# Patient Record
Sex: Female | Born: 1960 | Race: White | Hispanic: No | Smoking: Never smoker
Health system: Southern US, Community
[De-identification: ages and names within clinical notes are randomized; demographics above are authoritative.]

## PROBLEM LIST (undated history)

## (undated) DIAGNOSIS — M797 Fibromyalgia: Secondary | ICD-10-CM

## (undated) DIAGNOSIS — I471 Supraventricular tachycardia, unspecified: Secondary | ICD-10-CM

## (undated) DIAGNOSIS — G43909 Migraine, unspecified, not intractable, without status migrainosus: Secondary | ICD-10-CM

## (undated) DIAGNOSIS — E162 Hypoglycemia, unspecified: Secondary | ICD-10-CM

## (undated) HISTORY — PX: APPENDECTOMY: SHX54

## (undated) HISTORY — PX: TONSILLECTOMY: SUR1361

---

## 2017-09-20 ENCOUNTER — Encounter (HOSPITAL_COMMUNITY): Payer: Self-pay | Admitting: Emergency Medicine

## 2017-09-20 ENCOUNTER — Emergency Department (HOSPITAL_COMMUNITY): Payer: Self-pay

## 2017-09-20 DIAGNOSIS — S20212A Contusion of left front wall of thorax, initial encounter: Secondary | ICD-10-CM | POA: Insufficient documentation

## 2017-09-20 DIAGNOSIS — Y929 Unspecified place or not applicable: Secondary | ICD-10-CM | POA: Insufficient documentation

## 2017-09-20 DIAGNOSIS — W01198A Fall on same level from slipping, tripping and stumbling with subsequent striking against other object, initial encounter: Secondary | ICD-10-CM | POA: Insufficient documentation

## 2017-09-20 DIAGNOSIS — Y939 Activity, unspecified: Secondary | ICD-10-CM | POA: Insufficient documentation

## 2017-09-20 DIAGNOSIS — M25512 Pain in left shoulder: Secondary | ICD-10-CM | POA: Insufficient documentation

## 2017-09-20 DIAGNOSIS — Y999 Unspecified external cause status: Secondary | ICD-10-CM | POA: Insufficient documentation

## 2017-09-20 NOTE — ED Triage Notes (Addendum)
Pt from home following a fall 2 saturdays ago. Pt reports she slipped in some spilled water on the floor and fell on her a dog gate on her left side. Pt reports rib pain and shoulder pain. Pt reports 10/10 pain. Pt denies LOC or blood thinner use. Pt denies head injury. Pt reports she is only able to take shallow breaths. Pt has clear lung sounds in all fields

## 2017-09-21 ENCOUNTER — Emergency Department (HOSPITAL_COMMUNITY)
Admission: EM | Admit: 2017-09-21 | Discharge: 2017-09-21 | Disposition: A | Discharge status: Home / Self Care | Payer: Self-pay | Attending: Emergency Medicine | Admitting: Emergency Medicine

## 2017-09-21 DIAGNOSIS — S20212A Contusion of left front wall of thorax, initial encounter: Secondary | ICD-10-CM

## 2017-09-21 DIAGNOSIS — W19XXXA Unspecified fall, initial encounter: Secondary | ICD-10-CM

## 2017-09-21 HISTORY — DX: Hypoglycemia, unspecified: E16.2

## 2017-09-21 HISTORY — DX: Fibromyalgia: M79.7

## 2017-09-21 HISTORY — DX: Supraventricular tachycardia, unspecified: I47.10

## 2017-09-21 HISTORY — DX: Supraventricular tachycardia: I47.1

## 2017-09-21 HISTORY — DX: Migraine, unspecified, not intractable, without status migrainosus: G43.909

## 2017-09-21 MED ORDER — OXYCODONE-ACETAMINOPHEN 5-325 MG PO TABS
1.0000 | ORAL_TABLET | Freq: Once | ORAL | Status: DC
  Filled 2017-09-21: qty 1

## 2017-09-21 MED ORDER — OXYCODONE-ACETAMINOPHEN 5-325 MG PO TABS: 1.0000 | ORAL_TABLET | Freq: Four times a day (QID) | ORAL | 0 refills | Status: DC | PRN

## 2017-09-21 MED ORDER — NAPROXEN 500 MG PO TABS: 500.0000 mg | ORAL_TABLET | Freq: Two times a day (BID) | ORAL | 0 refills | Status: DC

## 2017-09-21 MED ORDER — KETOROLAC TROMETHAMINE 30 MG/ML IJ SOLN
30.0000 mg | Freq: Once | INTRAMUSCULAR | Status: AC
  Administered 2017-09-21: 30 mg via INTRAMUSCULAR
  Filled 2017-09-21: qty 1

## 2017-09-21 NOTE — ED Provider Notes (Signed)
[facility]-EMERGENCY DEPT Provider Note   CSN: [id number] Arrival date & time: 09/20/17  1859     History   Chief Complaint Chief Complaint  Patient presents with  . Fall    HPI Barbara Haynes is a 57 y.o. female.  HPI  This is a 57 year old female with a history of fibromyalgia who presents with left chest wall pain and left shoulder pain following a fall.  Patient reports that she fell 9 days ago at home.  She slipped and fell to water.  She hit a baby gate with her left chest and into her left axilla.  Since that time she has had progressively worsening left chest wall pain.  It is worse with coughing and breathing.  Current pain is 10 out of 10.  She has taken Advil at home with minimal relief.  She denies any coughs or fevers.  She denies hitting her head or loss of consciousness.  She is not on any anticoagulants.  Past Medical History:  Diagnosis Date  . Fibromyalgia   . Hypoglycemia   . Migraines   . SVT (supraventricular tachycardia) (HCC)     There are no active problems to display for this patient.   Past Surgical History:  Procedure Laterality Date  . APPENDECTOMY    . TONSILLECTOMY      OB History    No data available       Home Medications    Prior to Admission medications   Medication Sig Start Date End Date Taking? Authorizing Provider  naproxen (NAPROSYN) 500 MG tablet Take 1 tablet (500 mg total) by mouth 2 (two) times daily. 09/21/17   Earnestene Angello, Mayer Masker, MD  oxyCODONE-acetaminophen (PERCOCET/ROXICET) 5-325 MG tablet Take 1 tablet by mouth every 6 (six) hours as needed for severe pain. 09/21/17   Trevelle Mcgurn, Mayer Masker, MD    Family History No family history on file.  Social History Social History   Tobacco Use  . Smoking status: Never Smoker  . Smokeless tobacco: Never Used  Substance Use Topics  . Alcohol use: No    Frequency: Never  . Drug use: No     Allergies   Erythromycin; Benadryl [diphenhydramine]; and  Sulfa antibiotics   Review of Systems Review of Systems  Constitutional: Negative for fever.  Respiratory: Negative for cough and shortness of breath.   Cardiovascular: Positive for chest pain.  Musculoskeletal:       Shoulder pain  Neurological: Negative for weakness and numbness.  All other systems reviewed and are negative.    Physical Exam Updated Vital Signs BP 105/70 (BP Location: Left Arm)   Pulse 63   Temp 97.8 F (36.6 C) (Oral)   Resp 16   SpO2 96%   Physical Exam  Constitutional: She is oriented to person, place, and time. She appears well-developed and well-nourished.  HENT:  Head: Normocephalic and atraumatic.  Cardiovascular: Normal rate, regular rhythm and normal heart sounds.  Pulmonary/Chest: Effort normal and breath sounds normal. No respiratory distress. She has no wheezes. She exhibits tenderness.  Left chest wall tenderness to palpation, no crepitus, no significant overlying skin changes or contusion  Abdominal: Soft. There is no tenderness.  Musculoskeletal:  Normal range of motion left shoulder, no obvious deformities, no deformities of the clavicle noted  Neurological: She is alert and oriented to person, place, and time.  Skin: Skin is warm and dry.  Psychiatric: She has a normal mood and affect.  Nursing note and vitals reviewed.  ED Treatments / Results  Labs (all labs ordered are listed, but only abnormal results are displayed) Labs Reviewed - No data to display  EKG  EKG Interpretation None       Radiology Dg Chest 2 View  Result Date: 09/20/2017 CLINICAL DATA:  Fall, rib pain, shortness of Breath EXAM: CHEST  2 VIEW COMPARISON:  None. FINDINGS: Heart and mediastinal contours are within normal limits. No focal opacities or effusions. No acute bony abnormality. No visible rib fracture or pneumothorax. IMPRESSION: No active cardiopulmonary disease. Electronically Signed   By: Charlett NoseKevin  Dover M.D.   On: 09/20/2017 19:55   Dg Shoulder  Left  Result Date: 09/20/2017 CLINICAL DATA:  Fall.  Left shoulder pain EXAM: LEFT SHOULDER - 2+ VIEW COMPARISON:  None. FINDINGS: There is no evidence of fracture or dislocation. There is no evidence of arthropathy or other focal bone abnormality. Soft tissues are unremarkable. IMPRESSION: Negative. Electronically Signed   By: Charlett NoseKevin  Dover M.D.   On: 09/20/2017 19:54    Procedures Procedures (including critical care time)  Medications Ordered in ED Medications  oxyCODONE-acetaminophen (PERCOCET/ROXICET) 5-325 MG per tablet 1 tablet (1 tablet Oral Not Given 09/21/17 0230)  ketorolac (TORADOL) 30 MG/ML injection 30 mg (30 mg Intramuscular Given 09/21/17 0229)     Initial Impression / Assessment and Plan / ED Course  I have reviewed the triage vital signs and the nursing notes.  Pertinent labs & imaging results that were available during my care of the patient were reviewed by me and considered in my medical decision making (see chart for details).     Patient presents with left chest wall pain following a fall over 1 week ago.  She is nontoxic.  ABCs intact.  Breath sounds are clear.  She has pain without crepitus or overlying skin changes.  X-rays are negative.  Suspect occult fracture versus contusion.  Discussed with patient the role and importance of pain control.  She may have pain for 4-6 weeks if she has an occult rib fracture.  Patient was given IM Toradol.  We will give a short course of Percocet and an incentive spirometer to encourage deep breathing.  After history, exam, and medical workup I feel the patient has been appropriately medically screened and is safe for discharge home. Pertinent diagnoses were discussed with the patient. Patient was given return precautions.  Final Clinical Impressions(s) / ED Diagnoses   Final diagnoses:  Fall, initial encounter  Chest wall contusion, left, initial encounter    ED Discharge Orders        Ordered    naproxen (NAPROSYN) 500 MG  tablet  2 times daily     09/21/17 0337    oxyCODONE-acetaminophen (PERCOCET/ROXICET) 5-325 MG tablet  Every 6 hours PRN     09/21/17 0337       Shon BatonHorton, Alanya Vukelich F, MD 09/21/17 [phone]

## 2017-09-21 NOTE — Discharge Instructions (Signed)
You were seen today for left-sided chest pain related to a fall over 1 week ago.  Your x-rays are negative.  However, you may have an occult rib fracture.  Take pain medication as prescribed.  Make sure that you are taking big deep breaths to prevent pneumonia.

## 2018-10-03 ENCOUNTER — Observation Stay (HOSPITAL_COMMUNITY)
Admission: EM | Admit: 2018-10-03 | Discharge: 2018-10-04 | Disposition: A | Discharge status: Home / Self Care | Payer: Self-pay | Attending: Cardiology | Admitting: Cardiology

## 2018-10-03 ENCOUNTER — Other Ambulatory Visit: Payer: Self-pay

## 2018-10-03 ENCOUNTER — Emergency Department (HOSPITAL_COMMUNITY): Payer: Self-pay

## 2018-10-03 ENCOUNTER — Encounter (HOSPITAL_COMMUNITY): Payer: Self-pay | Admitting: *Deleted

## 2018-10-03 DIAGNOSIS — R079 Chest pain, unspecified: Principal | ICD-10-CM | POA: Insufficient documentation

## 2018-10-03 LAB — CBC
HCT: 41.2 % (ref 36.0–46.0)
HEMOGLOBIN: 13.8 g/dL (ref 12.0–15.0)
MCH: 31.3 pg (ref 26.0–34.0)
MCHC: 33.5 g/dL (ref 30.0–36.0)
MCV: 93.4 fL (ref 80.0–100.0)
Platelets: 315 10*3/uL (ref 150–400)
RBC: 4.41 MIL/uL (ref 3.87–5.11)
RDW: 11.9 % (ref 11.5–15.5)
WBC: 9.4 10*3/uL (ref 4.0–10.5)
nRBC: 0 % (ref 0.0–0.2)

## 2018-10-03 LAB — BASIC METABOLIC PANEL
ANION GAP: 8 (ref 5–15)
BUN: 22 mg/dL — ABNORMAL HIGH (ref 6–20)
CO2: 24 mmol/L (ref 22–32)
Calcium: 9.3 mg/dL (ref 8.9–10.3)
Chloride: 107 mmol/L (ref 98–111)
Creatinine, Ser: 1.3 mg/dL — ABNORMAL HIGH (ref 0.44–1.00)
GFR calc Af Amer: 53 mL/min — ABNORMAL LOW (ref >60)
GFR calc non Af Amer: 46 mL/min — ABNORMAL LOW (ref >60)
GLUCOSE: 100 mg/dL — AB (ref 70–99)
POTASSIUM: 4.5 mmol/L (ref 3.5–5.1)
Sodium: 139 mmol/L (ref 135–145)

## 2018-10-03 LAB — TROPONIN I
Troponin I: <0.03 ng/mL (ref <0.03)
Troponin I: <0.03 ng/mL (ref <0.03)

## 2018-10-03 LAB — I-STAT TROPONIN, ED: Troponin i, poc: 0 ng/mL (ref 0.00–0.08)

## 2018-10-03 LAB — I-STAT BETA HCG BLOOD, ED (MC, WL, AP ONLY): I-stat hCG, quantitative: 5.7 m[IU]/mL — ABNORMAL HIGH (ref <5)

## 2018-10-03 LAB — SEDIMENTATION RATE: Sed Rate: 15 mm/hr (ref 0–22)

## 2018-10-03 LAB — HEPARIN LEVEL (UNFRACTIONATED): HEPARIN UNFRACTIONATED: 0.29 [IU]/mL — AB (ref 0.30–0.70)

## 2018-10-03 MED ORDER — ASPIRIN EC 81 MG PO TBEC
81.0000 mg | DELAYED_RELEASE_TABLET | Freq: Every day | ORAL | Status: DC
  Administered 2018-10-04: 81 mg via ORAL
  Filled 2018-10-03: qty 1

## 2018-10-03 MED ORDER — HEPARIN (PORCINE) 25000 UT/250ML-% IV SOLN
1000.0000 [IU]/h | INTRAVENOUS | Status: DC
  Administered 2018-10-03: 800 [IU]/h via INTRAVENOUS
  Administered 2018-10-04: 1000 [IU]/h via INTRAVENOUS
  Filled 2018-10-03 (×2): qty 250

## 2018-10-03 MED ORDER — HEPARIN BOLUS VIA INFUSION
4000.0000 [IU] | Freq: Once | INTRAVENOUS | Status: AC
  Administered 2018-10-03: 4000 [IU] via INTRAVENOUS
  Filled 2018-10-03: qty 4000

## 2018-10-03 MED ORDER — ACETAMINOPHEN 325 MG PO TABS: 650.0000 mg | ORAL_TABLET | ORAL | Status: DC | PRN

## 2018-10-03 MED ORDER — NITROGLYCERIN 0.4 MG SL SUBL: 0.4000 mg | SUBLINGUAL_TABLET | SUBLINGUAL | Status: DC | PRN

## 2018-10-03 MED ORDER — SODIUM CHLORIDE 0.9% FLUSH
3.0000 mL | Freq: Once | INTRAVENOUS | Status: AC
  Administered 2018-10-03: 3 mL via INTRAVENOUS

## 2018-10-03 MED ORDER — ONDANSETRON HCL 4 MG/2ML IJ SOLN: 4.0000 mg | Freq: Four times a day (QID) | INTRAMUSCULAR | Status: DC | PRN

## 2018-10-03 MED ORDER — ASPIRIN 81 MG PO CHEW
324.0000 mg | CHEWABLE_TABLET | Freq: Once | ORAL | Status: AC
  Administered 2018-10-03: 324 mg via ORAL
  Filled 2018-10-03: qty 4

## 2018-10-03 NOTE — ED Notes (Signed)
Pt reports back pain, worsened when standing. Left arm weakness and chest tightness and pressure since last Monday.

## 2018-10-03 NOTE — Progress Notes (Signed)
ANTICOAGULATION CONSULT NOTE   Pharmacy Consult for heparin Indication: chest pain/ACS  Allergies  Allergen Reactions  . Erythromycin Anaphylaxis  . Adhesive [Tape] Other (See Comments)    Skin peels easily  . Benadryl [Diphenhydramine]     Skin crawling feeling   . Sulfa Antibiotics Other (See Comments)    Skin crawling feeling     Patient Measurements: Height: 5\' 2"  (157.5 cm) Weight: 155 lb 10.3 oz (70.6 kg) IBW/kg (Calculated) : 50.1 Heparin Dosing Weight: 65.9 kg  Vital Signs: Temp: 97.5 F (36.4 C) (02/10 1720) Temp Source: Oral (02/10 1720) BP: 119/80 (02/10 1720) Pulse Rate: 66 (02/10 1720)  Labs: Recent Labs    10/03/18 1301 10/03/18 1519 10/03/18 2030  HGB 13.8  --   --   HCT 41.2  --   --   PLT 315  --   --   HEPARINUNFRC  --   --  0.29*  CREATININE 1.30*  --   --   TROPONINI  --  <0.03  --     Estimated Creatinine Clearance: 44.5 mL/min (A) (by C-G formula based on SCr of 1.3 mg/dL (H)).   Medical History: Past Medical History:  Diagnosis Date  . Fibromyalgia   . Hypoglycemia   . Migraines   . SVT (supraventricular tachycardia) (HCC)     Medications:  Scheduled:  . [START ON 10/04/2018] aspirin EC  81 mg Oral Daily    Assessment: 57 yof presenting with chest pain on/off for a week with SOB. No anticoag PTA. No s/sx of bleeding. Baseline CBC ordered.   Initial heparin level just slightly below goal.  No overt bleeding or complications noted.  Goal of Therapy:  Heparin level 0.3-0.7 units/ml Monitor platelets by anticoagulation protocol: Yes   Plan:  Increase IV heparin to 900 units/hr Recheck heparin level in 6 hrs Daily heparin level and CBC.  Jenetta Downer, [facility] Clinical Pharmacist Phone [phone]  10/03/2018 9:49 PM

## 2018-10-03 NOTE — H&P (Signed)
Cardiology Admission History and Physical:   Patient ID: Barbara Haynes MRN: [id number]; DOB: 09-15-1960   Admission date: 10/03/2018  Primary Care Provider: Marliss Coots, NP Primary Cardiologist: No primary care provider on file.  Primary Electrophysiologist:  None   Chief Complaint:  Chest pain/Back pain  Patient Profile:   Barbara Haynes is a 58 y.o. female with PMH of SVT, fibromyalgia, and Migraines who presented with several days of back pain and chest pain.   History of Present Illness:   Barbara Haynes is a 58 yo female with PMH of SVT, fibromyalgia, and Migraines. Reports she was seen by cardiology about 30 years ago for SVT and MVP, and placed on medication. Family hx includes her mother having SVT, as well as her daughter who had a ablation done. Denies any CAD history other then a cousin who had an MI at the age of 15. She is followed by her PCP regularly. Denies any hx of HTN, HL or DM. She is a non-smoker. Does have significant hx of fibromyalgia. Also had a shoulder injury about a year ago and has been in PT for that.   States back on 1/22 she developed centralized back pain.  Denies any recent injury to the area, or picking up anything heavy.  States that this is been off and on since that time.  Reports she is used to having back pain on a regular basis as she has fractured her spine 3 different times, but this is been worse than her typical pain.  States last Monday she developed centralized chest pressure that did not respond to activity or rest.  This is been off and on for several days.  Denies any associated shortness of breath, nausea or vomiting, or diaphoresis.  States this morning around 930 she was lying in bed whenever she experienced a recurrence of this chest pressure.  Also had some weakness in her left arm, but no radiation of pain/heaviness into the arms or jaw.  States symptoms persisted and she presented to the ED.  In the ED her initial EKG showed sinus rhythm  with J-point elevation in inferior leads and V6.  She does not have a previous EKG in the system to compare to.  Does report that she had a EKG completed about a week ago at her primary care practice, but unable to locate this in her chart.  Her initial labs showed stable electrolytes, creatinine 1.3, POC troponin negative, hemoglobin 13.8.  Chest x-ray was negative for acute findings.  She was started on IV heparin in view.  At the time of assessment she complains of centralized back pain, and pressure across her chest.  Describes this as feeling like her " 17 pound cat was sitting on her chest", currently rating pressure 5/10.   Past Medical History:  Diagnosis Date  . Fibromyalgia   . Hypoglycemia   . Migraines   . SVT (supraventricular tachycardia) (HCC)     Past Surgical History:  Procedure Laterality Date  . APPENDECTOMY    . TONSILLECTOMY       Medications Prior to Admission: Prior to Admission medications   Medication Sig Start Date End Date Taking? Authorizing Provider  naproxen (NAPROSYN) 500 MG tablet Take 1 tablet (500 mg total) by mouth 2 (two) times daily. 09/21/17   Horton, Barbette Hair, MD  oxyCODONE-acetaminophen (PERCOCET/ROXICET) 5-325 MG tablet Take 1 tablet by mouth every 6 (six) hours as needed for severe pain. 09/21/17   Horton, Barbette Hair, MD  Allergies:    Allergies  Allergen Reactions  . Erythromycin Anaphylaxis  . Benadryl [Diphenhydramine]     Skin crawling feeling   . Sulfa Antibiotics Other (See Comments)    Skin crawling feeling     Social History:   Social History   Socioeconomic History  . Marital status: Single    Spouse name: Not on file  . Number of children: Not on file  . Years of education: Not on file  . Highest education level: Not on file  Occupational History  . Not on file  Social Needs  . Financial resource strain: Not on file  . Food insecurity:    Worry: Not on file    Inability: Not on file  . Transportation needs:     Medical: Not on file    Non-medical: Not on file  Tobacco Use  . Smoking status: Never Smoker  . Smokeless tobacco: Never Used  Substance and Sexual Activity  . Alcohol use: No    Frequency: Never  . Drug use: No  . Sexual activity: Not on file  Lifestyle  . Physical activity:    Days per week: Not on file    Minutes per session: Not on file  . Stress: Not on file  Relationships  . Social connections:    Talks on phone: Not on file    Gets together: Not on file    Attends religious service: Not on file    Active member of club or organization: Not on file    Attends meetings of clubs or organizations: Not on file    Relationship status: Not on file  . Intimate partner violence:    Fear of current or ex partner: Not on file    Emotionally abused: Not on file    Physically abused: Not on file    Forced sexual activity: Not on file  Other Topics Concern  . Not on file  Social History Narrative  . Not on file    Family History:   The patient's family history includes Supraventricular tachycardia in her mother and another family member.    ROS:  Please see the history of present illness.  All other ROS reviewed and negative.     Physical Exam/Data:   Vitals:   10/03/18 1234 10/03/18 1237 10/03/18 1332 10/03/18 1346  BP:  114/74 122/75 117/71  Pulse:  61 66 67  Resp:  18 12 (!) 7  Temp:  (!) 97.5 F (36.4 C)    TempSrc:  Oral    SpO2:  99% 99% 99%  Weight: 73.5 kg     Height: _0  (1.575 m)      No intake or output data in the 24 hours ending 10/03/18 1413 Last 3 Weights 10/03/2018  Weight (lbs) 162 lb  Weight (kg) 73.483 kg     Body mass index is 29.63 kg/m.  General:  Well nourished, well developed, in no acute distress HEENT: normal Lymph: no adenopathy Neck: no JVD Endocrine:  No thryomegaly Vascular: No carotid bruits; FA pulses 2+ bilaterally without bruits  Cardiac:  normal S1, S2; RRR; no murmur  Lungs:  clear to auscultation bilaterally, no  wheezing, rhonchi or rales  Abd: soft, nontender, no hepatomegaly  Ext: no edema Musculoskeletal:  No deformities, BUE and BLE strength normal and equal Skin: warm and dry  Neuro:  CNs 2-12 intact, no focal abnormalities noted Psych:  Normal affect    EKG:  The ECG that was done on 10/03/2018  was personally reviewed and demonstrates SR with j point elevation in inferior leads and v6. No previous EKG to compare  Relevant CV Studies:  N/a  Laboratory Data:  Chemistry Recent Labs  Lab 10/03/18 1301  NA 139  K 4.5  CL 107  CO2 24  GLUCOSE 100*  BUN 22*  CREATININE 1.30*  CALCIUM 9.3  GFRNONAA 46*  GFRAA 53*  ANIONGAP 8    No results for input(s): PROT, ALBUMIN, AST, ALT, ALKPHOS, BILITOT in the last 168 hours. Hematology Recent Labs  Lab 10/03/18 1301  WBC 9.4  RBC 4.41  HGB 13.8  HCT 41.2  MCV 93.4  MCH 31.3  MCHC 33.5  RDW 11.9  PLT 315   Cardiac EnzymesNo results for input(s): TROPONINI in the last 168 hours.  Recent Labs  Lab 10/03/18 1302  TROPIPOC 0.00    BNPNo results for input(s): BNP, PROBNP in the last 168 hours.  DDimer No results for input(s): DDIMER in the last 168 hours.  Radiology/Studies:  Dg Chest 2 View  Result Date: 10/03/2018 CLINICAL DATA:  Mid chest pain radiating to back EXAM: CHEST - 2 VIEW COMPARISON:  Chest x-ray dated 09/20/2017 FINDINGS: The heart size and mediastinal contours are within normal limits. Both lungs are clear. Stable mildly accentuated kyphosis at the thoracolumbar junction. No acute or suspicious osseous abnormality. IMPRESSION: No active cardiopulmonary disease. Electronically Signed   By: Franki Cabot M.D.   On: 10/03/2018 13:29    Assessment and Plan:   Barbara Haynes is a 58 y.o. female with PMH of SVT, fibromyalgia, and Migraines who presented with several days of back pain and chest pain.   1. Chest pain: Reports intermittent episodes of chest pressure/pain since last Monday. Not associated with rest or  exertion. Episode today started at 9:30am and felt like her cat sitting on her chest. No associated shortness of breath, n/v or diaphoresis. Initial POC trop neg in the ED. Placed on IV heparin while in the ED. No hx of HTN, HL or DM, and she is a non-smoker.  -- admit and cycle troponins -- plan for coronary CT in the morning -- continue IV heparin for now pending troponins -- check ESR and Sed rate -- echo  2. Back pain: reports hx of fibromyalgia and has chronic pain from that. This is reported as more intense. Will review home medications and order as appropriate.   3. SVT: states this was over 30 years, and has not been followed by cards since then. No recurrence. Monitor on telemetry.  4. MVP: dx years ago, no issues.  -- echo as above   Severity of Illness: The appropriate patient status for this patient is OBSERVATION. Observation status is judged to be reasonable and necessary in order to provide the required intensity of service to ensure the patient's safety. The patient's presenting symptoms, physical exam findings, and initial radiographic and laboratory data in the context of their medical condition is felt to place them at decreased risk for further clinical deterioration. Furthermore, it is anticipated that the patient will be medically stable for discharge from the hospital within 2 midnights of admission. The following factors support the patient status of observation.   " The patient's presenting symptoms include chest pain. " The physical exam findings include normal exam. " The initial radiographic and laboratory data are stable labs, abnormal EKG.  For questions or updates, please contact [place] Please consult [email or web address] for contact info under    Signed, Reino Bellis, NP  10/03/2018 2:13 PM

## 2018-10-03 NOTE — ED Provider Notes (Signed)
[facility] EMERGENCY DEPARTMENT Provider Note   CSN: [id number] Arrival date & time: 10/03/18  1224     History   Chief Complaint Chief Complaint  Patient presents with  . Chest Pain    HPI Barbara Haynes is a 58 y.o. female.  HPI Patient reports she been having chest pain on and off for a little under a week.  She reports she feels pressure on the front of her chest.  She reports she has a 17 pound cat in when she is having pain feels like he is lying on her chest.  She reports as well the pain will spread out across her back.  She reports she starts to feel short of breath when she has it.  She has not had nausea or vomiting or syncope.  Patient reports this has been waxing and waning lasting 5 to 15 minutes over the past week.  She denies however that is being precipitated by exertion.  Patient does not lead a very active lifestyle but she is able to go shopping or do typical activities without getting exertional shortness of breath or chest pain.  She has history of fibromyalgia.  She reports because of that oftentimes when she has pain she usually attributes it to fibromyalgia.  She reports she does have a history of SVT and mitral valve prolapse.  She denies ever having coronary artery disease or history of catheterization.  Patient reports she does have a cousin who had early onset massive MI.  Patient where she was seen this week at an outpatient clinic for chest pain. Past Medical History:  Diagnosis Date  . Fibromyalgia   . Hypoglycemia   . Migraines   . SVT (supraventricular tachycardia) [facility])     Patient Active Problem List   Diagnosis Date Noted  . Chest pain 10/03/2018    Past Surgical History:  Procedure Laterality Date  . APPENDECTOMY    . TONSILLECTOMY       OB History   No obstetric history on file.      Home Medications    Prior to Admission medications   Medication Sig Start Date End Date Taking? Authorizing Provider  meloxicam  (MOBIC) 15 MG tablet Take 15 mg by mouth daily. 09/23/18   [provider]  naproxen (NAPROSYN) 500 MG tablet Take 1 tablet (500 mg total) by mouth 2 (two) times daily. Patient not taking: Reported on 10/03/2018 09/21/17   Horton, Mayer Masker, MD  oxyCODONE-acetaminophen (PERCOCET/ROXICET) 5-325 MG tablet Take 1 tablet by mouth every 6 (six) hours as needed for severe pain. Patient not taking: Reported on 10/03/2018 09/21/17   Horton, Mayer Masker, MD  PROVENTIL HFA 108 [phone] Base) MCG/ACT inhaler Inhale 2 puffs into the lungs 4 (four) times daily as needed for shortness of breath. 09/23/18   [provider]  sertraline (ZOLOFT) 100 MG tablet Take 100 mg by mouth every morning. 09/23/18   [provider]    Family History Family History  Problem Relation Age of Onset  . Supraventricular tachycardia Mother   . Supraventricular tachycardia Other     Social History Social History   Tobacco Use  . Smoking status: Never Smoker  . Smokeless tobacco: Never Used  Substance Use Topics  . Alcohol use: No    Frequency: Never  . Drug use: No     Allergies   Erythromycin; Adhesive [tape]; Benadryl [diphenhydramine]; and Sulfa antibiotics   Review of Systems Review of Systems 10 Systems reviewed and  are negative for acute change except as noted in the HPI.   Physical Exam Updated Vital Signs BP 105/62   Pulse 68   Temp (!) 97.5 F (36.4 C) (Oral)   Resp 12   Ht 5\' 2"  (1.575 m)   Wt 73.5 kg   SpO2 100%   BMI 29.63 kg/m   Physical Exam Constitutional:      Appearance: Normal appearance.     Comments: Patient is alert and well in appearance.  She does appear to be slightly hyperventilating.  Patient is not diaphoretic or pale.  HENT:     Head: Normocephalic and atraumatic.  Eyes:     Extraocular Movements: Extraocular movements intact.  Cardiovascular:     Rate and Rhythm: Normal rate and regular rhythm.     Pulses: Normal pulses.     Heart sounds: Normal  heart sounds.  Pulmonary:     Effort: Pulmonary effort is normal.     Breath sounds: Normal breath sounds.     Comments: Mild hyperventilation.  No respiratory distress. Abdominal:     General: There is no distension.     Palpations: Abdomen is soft.     Tenderness: There is no abdominal tenderness. There is no guarding.  Musculoskeletal: Normal range of motion.        General: No swelling or tenderness.  Skin:    General: Skin is warm and dry.  Neurological:     Mental Status: She is alert and oriented to person, place, and time.     Coordination: Coordination normal.  Psychiatric:        Mood and Affect: Mood normal.      ED Treatments / Results  Labs (all labs ordered are listed, but only abnormal results are displayed) Labs Reviewed  BASIC METABOLIC PANEL - Abnormal; Notable for the following components:      Result Value   Glucose, Bld 100 (*)    BUN 22 (*)    Creatinine, Ser 1.30 (*)    GFR calc non Af Amer 46 (*)    GFR calc Af Amer 53 (*)    All other components within normal limits  I-STAT BETA HCG BLOOD, ED ([facility], [facility], [facility] ONLY) - Abnormal; Notable for the following components:   I-stat hCG, quantitative 5.7 (*)    All other components within normal limits  CBC  HEPARIN LEVEL (UNFRACTIONATED)  I-STAT TROPONIN, ED    EKG EKG Interpretation  Date/Time:  Monday October 03 2018 12:35:43 EST Ventricular Rate:  63 PR Interval:  152 QRS Duration: 84 QT Interval:  384 QTC Calculation: 392 R Axis:   73 Text Interpretation:  Normal sinus rhythm inferior j point elevation, no old comp Confirmed by Arby BarrettePfeiffer, Kaloni Bisaillon [phone]([id number]) on 10/03/2018 12:52:34 PM   Radiology Dg Chest 2 View  Result Date: 10/03/2018 CLINICAL DATA:  Mid chest pain radiating to back EXAM: CHEST - 2 VIEW COMPARISON:  Chest x-ray dated 09/20/2017 FINDINGS: The heart size and mediastinal contours are within normal limits. Both lungs are clear. Stable mildly accentuated kyphosis at the thoracolumbar  junction. No acute or suspicious osseous abnormality. IMPRESSION: No active cardiopulmonary disease. Electronically Signed   By: Bary RichardStan  Maynard M.D.   On: 10/03/2018 13:29    Procedures Procedures (including critical care time) CRITICAL CARE Performed by: Arby BarretteMarcy Caydyn Sprung   Total critical care time: 30 minutes  Critical care time was exclusive of separately billable procedures and treating other patients.  Critical care was necessary to treat or prevent imminent  or life-threatening deterioration.  Critical care was time spent personally by me on the following activities: development of treatment plan with patient and/or surrogate as well as nursing, discussions with consultants, evaluation of patient's response to treatment, examination of patient, obtaining history from patient or surrogate, ordering and performing treatments and interventions, ordering and review of laboratory studies, ordering and review of radiographic studies, pulse oximetry and re-evaluation of patient's condition. Medications Ordered in ED Medications  heparin ADULT infusion 100 units/mL (25000 units/260mL sodium chloride 0.45%) (800 Units/hr Intravenous New Bag/Given 10/03/18 1344)  sodium chloride flush (NS) 0.9 % injection 3 mL (3 mLs Intravenous Given 10/03/18 1348)  aspirin chewable tablet 324 mg (324 mg Oral Given 10/03/18 1457)  heparin bolus via infusion 4,000 Units (4,000 Units Intravenous Bolus from Bag 10/03/18 1345)     Initial Impression / Assessment and Plan / ED Course  I have reviewed the triage vital signs and the nursing notes.  Pertinent labs & imaging results that were available during my care of the patient were reviewed by me and considered in my medical decision making (see chart for details).  Clinical Course as of Oct 03 1508  Mon Oct 03, 2018  1307 Consult: Reviewed Dr. Excell Seltzer.  Borderline EKG.  Recommend to start heparin and will be seen in consult ASAP to determine ACS backslash potential  for STEMI.   [MP]    Clinical Course User Index [MP] Arby Barrette, MD   Patient has been having chest pain intermittently for about a week.  Quality of chest pain is concerning.  However, chest pain is not being precipitated by exertion.  EKG is borderline with out available comparison at this time.  Patient to be started empirically on heparin and aspirin.  Awaiting cardiology consultation and return of diagnostic studies.  Cardiology will admit for ACS rule out.  Final Clinical Impressions(s) / ED Diagnoses   Final diagnoses:  Chest pain, unspecified type  Chest pain    ED Discharge Orders    None       Arby Barrette, MD 10/03/18 1510

## 2018-10-03 NOTE — Progress Notes (Deleted)
ANTICOAGULATION CONSULT NOTE - Initial Consult  Pharmacy Consult for Heparin Indication: chest pain/ACS  Allergies  Allergen Reactions  . Erythromycin Anaphylaxis  . Adhesive [Tape] Other (See Comments)    Skin peels easily  . Benadryl [Diphenhydramine]     Skin crawling feeling   . Sulfa Antibiotics Other (See Comments)    Skin crawling feeling     Patient Measurements: Height: 5\' 2"  (157.5 cm) Weight: 162 lb (73.5 kg) IBW/kg (Calculated) : 50.1  Vital Signs: Temp: 97.5 F (36.4 C) (02/10 1237) Temp Source: Oral (02/10 1237) BP: 105/62 (02/10 1500) Pulse Rate: 68 (02/10 1500)  Labs: Recent Labs    10/03/18 1301  HGB 13.8  HCT 41.2  PLT 315  CREATININE 1.30*    Estimated Creatinine Clearance: 45.4 mL/min (A) (by C-G formula based on SCr of 1.3 mg/dL (H)).   Medical History: Past Medical History:  Diagnosis Date  . Fibromyalgia   . Hypoglycemia   . Migraines   . SVT (supraventricular tachycardia) [facility])     Assessment: 57 year old female to begin heparin for chest pain pending troponins  Goal of Therapy:  Heparin level 0.3-0.7 units/ml Monitor platelets by anticoagulation protocol: Yes   Plan:  Heparin 4000 units iv bolus x 1 Heparin drip at 1000 units / hr Heparin level 6 hours after heparin starts Daily heparin level, CBC  Thank you Okey Regal, PharmD [phone]  10/03/2018,3:21 PM

## 2018-10-03 NOTE — ED Notes (Signed)
Cards PA at bedside. 

## 2018-10-03 NOTE — ED Notes (Signed)
Late lunch tray ordered. Crackers and ginger ale given.

## 2018-10-03 NOTE — Progress Notes (Addendum)
ANTICOAGULATION CONSULT NOTE - Initial Consult  Pharmacy Consult for heparin Indication: chest pain/ACS  Allergies  Allergen Reactions  . Erythromycin Anaphylaxis  . Benadryl [Diphenhydramine]     Skin crawling feeling   . Sulfa Antibiotics Other (See Comments)    Skin crawling feeling     Patient Measurements: Height: 5\' 2"  (157.5 cm) Weight: 162 lb (73.5 kg) IBW/kg (Calculated) : 50.1 Heparin Dosing Weight: 65.9 kg  Vital Signs: Temp: 97.5 F (36.4 C) (02/10 1237) Temp Source: Oral (02/10 1237) BP: 114/74 (02/10 1237) Pulse Rate: 61 (02/10 1237)  Labs: No results for input(s): HGB, HCT, PLT, APTT, LABPROT, INR, HEPARINUNFRC, HEPRLOWMOCWT, CREATININE, CKTOTAL, CKMB, TROPONINI in the last 72 hours.  CrCl cannot be calculated (No successful lab value found.).   Medical History: Past Medical History:  Diagnosis Date  . Fibromyalgia   . Hypoglycemia   . Migraines   . SVT (supraventricular tachycardia) (HCC)     Medications:  Scheduled:  . aspirin  324 mg Oral Once  . heparin  4,000 Units Intravenous Once  . sodium chloride flush  3 mL Intravenous Once    Assessment: 57 yof presenting with chest pain on/off for a week with SOB. No anticoag PTA. No s/sx of bleeding. Baseline CBC ordered.   Goal of Therapy:  Heparin level 0.3-0.7 units/ml Monitor platelets by anticoagulation protocol: Yes   Plan:  Give 4000 units bolus x 1 Start heparin infusion at 800 units/hr Check anti-Xa level in 6 hours and daily while on heparin Continue to monitor H&H and platelets  Sherron Monday, PharmD, BCCCP Clinical Pharmacist  Pager: [phone] Phone: [phone] 10/03/2018,1:14 PM

## 2018-10-04 ENCOUNTER — Observation Stay (HOSPITAL_BASED_OUTPATIENT_CLINIC_OR_DEPARTMENT_OTHER): Payer: Self-pay

## 2018-10-04 ENCOUNTER — Observation Stay (HOSPITAL_COMMUNITY): Payer: Self-pay

## 2018-10-04 DIAGNOSIS — I340 Nonrheumatic mitral (valve) insufficiency: Secondary | ICD-10-CM

## 2018-10-04 DIAGNOSIS — R079 Chest pain, unspecified: Secondary | ICD-10-CM

## 2018-10-04 DIAGNOSIS — I370 Nonrheumatic pulmonary valve stenosis: Secondary | ICD-10-CM

## 2018-10-04 DIAGNOSIS — R072 Precordial pain: Secondary | ICD-10-CM

## 2018-10-04 LAB — TROPONIN I: Troponin I: <0.03 ng/mL (ref <0.03)

## 2018-10-04 LAB — CBC
HCT: 35.5 % — ABNORMAL LOW (ref 36.0–46.0)
Hemoglobin: 12.1 g/dL (ref 12.0–15.0)
MCH: 31.3 pg (ref 26.0–34.0)
MCHC: 34.1 g/dL (ref 30.0–36.0)
MCV: 91.7 fL (ref 80.0–100.0)
PLATELETS: 259 10*3/uL (ref 150–400)
RBC: 3.87 MIL/uL (ref 3.87–5.11)
RDW: 12 % (ref 11.5–15.5)
WBC: 8.5 10*3/uL (ref 4.0–10.5)
nRBC: 0 % (ref 0.0–0.2)

## 2018-10-04 LAB — BASIC METABOLIC PANEL
Anion gap: 11 (ref 5–15)
BUN: 20 mg/dL (ref 6–20)
CO2: 23 mmol/L (ref 22–32)
CREATININE: 1.06 mg/dL — AB (ref 0.44–1.00)
Calcium: 8.6 mg/dL — ABNORMAL LOW (ref 8.9–10.3)
Chloride: 107 mmol/L (ref 98–111)
GFR calc Af Amer: >60 mL/min (ref >60)
GFR calc non Af Amer: 59 mL/min — ABNORMAL LOW (ref >60)
Glucose, Bld: 107 mg/dL — ABNORMAL HIGH (ref 70–99)
Potassium: 3.9 mmol/L (ref 3.5–5.1)
SODIUM: 141 mmol/L (ref 135–145)

## 2018-10-04 LAB — LIPID PANEL
Cholesterol: 206 mg/dL — ABNORMAL HIGH (ref 0–200)
HDL: 36 mg/dL — ABNORMAL LOW (ref >40)
LDL Cholesterol: 112 mg/dL — ABNORMAL HIGH (ref 0–99)
Total CHOL/HDL Ratio: 5.7 RATIO
Triglycerides: 292 mg/dL — ABNORMAL HIGH (ref <150)
VLDL: 58 mg/dL — ABNORMAL HIGH (ref 0–40)

## 2018-10-04 LAB — ECHOCARDIOGRAM COMPLETE
Height: 62 in
Weight: 2526.4 oz

## 2018-10-04 LAB — HEMOGLOBIN A1C
Hgb A1c MFr Bld: 5.1 % (ref 4.8–5.6)
Mean Plasma Glucose: 99.67 mg/dL

## 2018-10-04 LAB — HEPARIN LEVEL (UNFRACTIONATED): Heparin Unfractionated: 0.31 IU/mL (ref 0.30–0.70)

## 2018-10-04 LAB — HIV ANTIBODY (ROUTINE TESTING W REFLEX): HIV Screen 4th Generation wRfx: NONREACTIVE

## 2018-10-04 MED ORDER — NITROGLYCERIN 0.4 MG SL SUBL
0.8000 mg | SUBLINGUAL_TABLET | Freq: Once | SUBLINGUAL | Status: AC
  Administered 2018-10-04: 0.8 mg via SUBLINGUAL

## 2018-10-04 MED ORDER — NITROGLYCERIN 0.4 MG SL SUBL
SUBLINGUAL_TABLET | SUBLINGUAL | Status: AC
  Filled 2018-10-04: qty 2

## 2018-10-04 MED ORDER — IOPAMIDOL (ISOVUE-370) INJECTION 76%
80.0000 mL | Freq: Once | INTRAVENOUS | Status: AC | PRN
  Administered 2018-10-04: 80 mL via INTRAVENOUS

## 2018-10-04 NOTE — Progress Notes (Addendum)
ANTICOAGULATION CONSULT NOTE   Pharmacy Consult for heparin Indication: chest pain/ACS   Assessment: 14 yof presenting with chest pain on/off for a week with SOB. No anticoag PTA. No s/sx of bleeding. Baseline CBC ordered.   Heparin level at low end of goal (0.31) on gtt at 900 units/hr. No issues with infusion or bleeding per RN.   Goal of Therapy:  Heparin level 0.3-0.7 units/ml Monitor platelets by anticoagulation protocol: Yes   Plan:  Increase IV heparin slightly to 1000 units/hr Follow up AM labs  Resume home meds? - Sertraline / Wellbutrin SR      Allergies  Allergen Reactions  . Erythromycin Anaphylaxis  . Adhesive [Tape] Other (See Comments)    Skin peels easily  . Benadryl [Diphenhydramine]     Skin crawling feeling   . Sulfa Antibiotics Other (See Comments)    Skin crawling feeling     Patient Measurements: Height: 5\' 2"  (157.5 cm) Weight: 157 lb 14.4 oz (71.6 kg) IBW/kg (Calculated) : 50.1 Heparin Dosing Weight: 65.9 kg  Vital Signs: Temp: 98.2 F (36.8 C) (02/11 0326) Temp Source: Oral (02/11 0326) BP: 106/62 (02/11 0326) Pulse Rate: 65 (02/11 0326)  Labs: Recent Labs    10/03/18 1301 10/03/18 1519 10/03/18 2030 10/04/18 0320  HGB 13.8  --   --  12.1  HCT 41.2  --   --  35.5*  PLT 315  --   --  259  HEPARINUNFRC  --   --  0.29* 0.31  CREATININE 1.30*  --   --  1.06*  TROPONINI  --  <0.03 <0.03 <0.03    Estimated Creatinine Clearance: 54.9 mL/min (A) (by C-G formula based on SCr of 1.06 mg/dL (H)).   Medical History: Past Medical History:  Diagnosis Date  . Fibromyalgia   . Hypoglycemia   . Migraines   . SVT (supraventricular tachycardia) (HCC)     Medications:  Scheduled:  . aspirin EC  81 mg Oral Daily     Thank you Skylynne Poorbaugh, PharmD [phone]  10/04/2018

## 2018-10-04 NOTE — Progress Notes (Signed)
Progress Note  Patient Name: Barbara Haynes Date of Encounter: 10/04/2018  Primary Cardiologist: Jodelle RedBridgette Christopher, MD   Subjective   Doing ok this morning. No complaints other than being NPO for test. Denies CP at the moment.   Inpatient Medications    Scheduled Meds: . aspirin EC  81 mg Oral Daily   Continuous Infusions: . heparin 1,000 Units/hr (10/04/18 0547)   PRN Meds: acetaminophen, nitroGLYCERIN, ondansetron (ZOFRAN) IV   Vital Signs    Vitals:   10/03/18 1700 10/03/18 1720 10/03/18 2224 10/04/18 0326  BP: 107/67 119/80 112/68 106/62  Pulse: 69 66  65  Resp: 13  14 14   Temp:  (!) 97.5 F (36.4 C) 98.1 F (36.7 C) 98.2 F (36.8 C)  TempSrc:  Oral Oral Oral  SpO2: 98% 98% 97% 96%  Weight:  70.6 kg  71.6 kg  Height:  5\' 2"  (1.575 m)      Intake/Output Summary (Last 24 hours) at 10/04/2018 [id number] Last data filed at 10/04/2018 0600 Gross per 24 hour  Intake 169.69 ml  Output -  Net 169.69 ml   Last 3 Weights 10/04/2018 10/03/2018 10/03/2018  Weight (lbs) 157 lb 14.4 oz 155 lb 10.3 oz 162 lb  Weight (kg) 71.623 kg 70.6 kg 73.483 kg      Telemetry    NSR - Personally Reviewed  ECG    NSR inferior j point elevation, no old comp- Personally Reviewed  Physical Exam   GEN: middle aged WF, in No acute distress.   Neck: No JVD Cardiac: RRR, no murmurs, rubs, or gallops.  Respiratory: Clear to auscultation bilaterally. GI: Soft, nontender, non-distended  MS: No edema; No deformity. Neuro:  Nonfocal  Psych: Normal affect   Labs    Chemistry Recent Labs  Lab 10/03/18 1301 10/04/18 0320  NA 139 141  K 4.5 3.9  CL 107 107  CO2 24 23  GLUCOSE 100* 107*  BUN 22* 20  CREATININE 1.30* 1.06*  CALCIUM 9.3 8.6*  GFRNONAA 46* 59*  GFRAA 53* >60  ANIONGAP 8 11     Hematology Recent Labs  Lab 10/03/18 1301 10/04/18 0320  WBC 9.4 8.5  RBC 4.41 3.87  HGB 13.8 12.1  HCT 41.2 35.5*  MCV 93.4 91.7  MCH 31.3 31.3  MCHC 33.5 34.1  RDW 11.9 12.0   PLT 315 259    Cardiac Enzymes Recent Labs  Lab 10/03/18 1519 10/03/18 2030 10/04/18 0320  TROPONINI <0.03 <0.03 <0.03    Recent Labs  Lab 10/03/18 1302  TROPIPOC 0.00     BNPNo results for input(s): BNP, PROBNP in the last 168 hours.   DDimer No results for input(s): DDIMER in the last 168 hours.   Radiology    Dg Chest 2 View  Result Date: 10/03/2018 CLINICAL DATA:  Mid chest pain radiating to back EXAM: CHEST - 2 VIEW COMPARISON:  Chest x-ray dated 09/20/2017 FINDINGS: The heart size and mediastinal contours are within normal limits. Both lungs are clear. Stable mildly accentuated kyphosis at the thoracolumbar junction. No acute or suspicious osseous abnormality. IMPRESSION: No active cardiopulmonary disease. Electronically Signed   By: Bary RichardStan  Maynard M.D.   On: 10/03/2018 13:29    Cardiac Studies   2D Echo pending  Coronary CTA pending   Patient Profile     Barbara Haynes is a 58 y.o. female with PMH of SVT, fibromyalgia, and Migraines who presented with several days of back pain and chest pain.   Assessment & Plan  1. Chest and Back Pain: ruled out for acute MI with negative troponins x 3. ECG here notes concave J point elevation in inferior leads and V6. CXR unremarkable. Plan for coronary CTA today to define coronary anatomy. 2D echo also pending. Further cardiac w/u TBD based on coronary CTA and echo results.   2. AKI: SCr on admit was 1.30 and BUN 20. Levels improved today. Scr 1.06 and BUN normal at 20. Getting coronary CTA today. F/u BMP in the AM.   For questions or updates, please contact [facility] Please consult [email or web address] for contact info under        Signed, Robbie LisBrittainy Marshayla Mitschke, PA-C  10/04/2018, 8:28 AM

## 2018-10-04 NOTE — Discharge Summary (Signed)
Discharge Summary    Patient ID: Barbara Haynes MRN: [id number]; DOB: [birth year]  Admit date: 10/03/2018 Discharge date: 10/04/2018  Primary Care Provider: Lavinia Sharps, NP  Primary Cardiologist: Jodelle Red, MD  Primary Electrophysiologist:  None   Discharge Diagnoses    Active Problems:   Chest pain   Allergies Allergies  Allergen Reactions  . Erythromycin Anaphylaxis  . Adhesive [Tape] Other (See Comments)    Skin peels easily  . Benadryl [Diphenhydramine]     Skin crawling feeling   . Sulfa Antibiotics Other (See Comments)    Skin crawling feeling     Diagnostic Studies/Procedures    Coronary CTA 10/04/18   IMPRESSION: 1. Coronary calcium score of 0. This was 0 percentile for age and sex matched control.  2. Normal coronary origin with right dominance.  3. No evidence of CAD.  Consider non-cardiac sources of chest pain.   History of Present Illness     Barbara Haynes is a 58 yo female with PMH of SVT, fibromyalgia, and Migraines. Reports she was seen by cardiology about 30 years ago for SVT and MVP, and placed on medication. Family hx includes her mother having SVT, as well as her daughter who had a ablation done. Denies any CAD history other then a cousin who had an MI at the age of 28. She is followed by her PCP regularly. Denies any hx of HTN, HL or DM. She is a non-smoker. Does have significant hx of fibromyalgia. Also had a shoulder injury about a year ago and has been in PT for that.   Pt presented to the [facility] ED on 10/03/18 with CC of chest and back pain. She noted >1 week of centralized back and chest pain, not related to exertion. No associated symptoms. She had an episode of central chest pressure the morning of presentation which persisted.  Her EKG showed concave J point elevation in inferior leads and V6. No prior EKGs for comparison. Initial troponin was negative. Pt was admitted to tele for observation and further w/u.    Hospital Course       Pt rule out for MI with negative enzymes x 3. She underwent a coronary CTA to define coronary anatomy and study was normal. She had no recurrent symptoms while under observation. Echo was done prior to discharge and results will be reported to pt via phone. Based on history, inpatient w/u and clinical course, there was felt to be no need for additional testing. Pt was last seen and examined by Dr. Cristal Deer and felt stable for discharge home. Outpatient f/u with cardiology will be arranged.   Consultants: none   Discharge Vitals Blood pressure 107/63, pulse 68, temperature 98.2 F (36.8 C), temperature source Oral, resp. rate 14, height 5\' 2"  (1.575 m), weight 71.6 kg, SpO2 98 %.  Filed Weights   10/03/18 1234 10/03/18 1720 10/04/18 0326  Weight: 73.5 kg 70.6 kg 71.6 kg    Labs & Radiologic Studies    CBC Recent Labs    10/03/18 1301 10/04/18 0320  WBC 9.4 8.5  HGB 13.8 12.1  HCT 41.2 35.5*  MCV 93.4 91.7  PLT 315 259   Basic Metabolic Panel Recent Labs    08/11/74 1301 10/04/18 0320  NA 139 141  K 4.5 3.9  CL 107 107  CO2 24 23  GLUCOSE 100* 107*  BUN 22* 20  CREATININE 1.30* 1.06*  CALCIUM 9.3 8.6*   Liver Function Tests No results for input(s): AST, ALT, ALKPHOS, BILITOT,  PROT, ALBUMIN in the last 72 hours. No results for input(s): LIPASE, AMYLASE in the last 72 hours. Cardiac Enzymes Recent Labs    10/03/18 1519 10/03/18 2030 10/04/18 0320  TROPONINI <0.03 <0.03 <0.03   BNP Invalid input(s): POCBNP D-Dimer No results for input(s): DDIMER in the last 72 hours. Hemoglobin A1C Recent Labs    10/04/18 0320  HGBA1C 5.1   Fasting Lipid Panel Recent Labs    10/04/18 0320  CHOL 206*  HDL 36*  LDLCALC 112*  TRIG 292*  CHOLHDL 5.7   Thyroid Function Tests No results for input(s): TSH, T4TOTAL, T3FREE, THYROIDAB in the last 72 hours.  Invalid input(s): FREET3 _____________  Dg Chest 2 View  Result Date: 10/03/2018 CLINICAL DATA:  Mid chest  pain radiating to back EXAM: CHEST - 2 VIEW COMPARISON:  Chest x-ray dated 09/20/2017 FINDINGS: The heart size and mediastinal contours are within normal limits. Both lungs are clear. Stable mildly accentuated kyphosis at the thoracolumbar junction. No acute or suspicious osseous abnormality. IMPRESSION: No active cardiopulmonary disease. Electronically Signed   By: Bary Richard M.D.   On: 10/03/2018 13:29   Ct Coronary Morph W/cta Cor W/score W/ca W/cm &/or Wo/cm  Addendum Date: 10/04/2018   ADDENDUM REPORT: 10/04/2018 13:20 CLINICAL DATA:  58 year old female with chest pain. EXAM: Cardiac/Coronary  CT TECHNIQUE: The patient was scanned on a [employer]. FINDINGS: A 120 kV prospective scan was triggered in the descending thoracic aorta at 111 HU's. Axial non-contrast 3 mm slices were carried out through the heart. The data set was analyzed on a dedicated work station and scored using the Agatson method. Gantry rotation speed was 250 msecs and collimation was .6 mm. No beta blockade and 0.8 mg of sl NTG was given. The 3D data set was reconstructed in 5% intervals of the 67-82 % of the R-R cycle. Diastolic phases were analyzed on a dedicated work station using MPR, MIP and VRT modes. The patient received 80 cc of contrast. Aorta:  Normal size.  No calcifications.  No dissection. Aortic Valve:  Trileaflet.  No calcifications. Coronary Arteries:  Normal coronary origin.  Right dominance. RCA is a large dominant artery that gives rise to PDA and PLVB. There is no plaque. Left main is a large artery that gives rise to LAD and LCX arteries. LAD is a large vessel that gives rise to one diagonal artery and has no plaque. LCX is a non-dominant artery that gives rise to one large OM1 branch. There is no plaque. Other findings: Normal pulmonary vein drainage into the left atrium. Normal let atrial appendage without a thrombus. Normal size of the pulmonary artery. IMPRESSION: 1. Coronary calcium score of 0. This  was 0 percentile for age and sex matched control. 2. Normal coronary origin with right dominance. 3. No evidence of CAD.  Consider non-cardiac sources of chest pain. Electronically Signed   By: Tobias Alexander   On: 10/04/2018 13:20   Result Date: 10/04/2018 EXAM: OVER-READ INTERPRETATION CT CHEST The following report is an over-read performed by radiologist Dr. Hulan Saas of [facility] on 10/04/2018. This over-read does not include interpretation of cardiac or coronary anatomy or pathology. The coronary calcium score/coronary CTA interpretation by the cardiologist is attached. COMPARISON:  None. FINDINGS: Vascular: No visible atherosclerosis and no evidence of aneurysm involving the visualized aorta. Mediastinum/Nodes: No pathologic lymphadenopathy within the visualized mediastinum. Visualized esophagus normal in appearance. Lungs/Pleura: Visualized lung parenchyma clear. Central bronchi patent without significant bronchial wall thickening. No  pleural effusions. Upper Abdomen: Unremarkable for the early arterial phase of enhancement which accounts for the heterogeneous enhancement of the spleen. Musculoskeletal: Mild lower thoracic spondylosis. IMPRESSION: No significant extracardiac findings. Electronically Signed: By: Hulan Saashomas  Lawrence M.D. On: 10/04/2018 12:43   Disposition   Pt is being discharged home today in good condition.  Follow-up Plans & Appointments    Follow-up Information    Jodelle Redhristopher, Bridgette, MD Follow up.   Specialty:  Cardiology Why:  our office will call you with a follow-up appointment in 2-4 weeks  Contact information: [address] [place] [id number] [phone]          Discharge Instructions    Diet - low sodium heart healthy   Complete by:  As directed    Increase activity slowly   Complete by:  As directed       Discharge Medications   Allergies as of 10/04/2018      Reactions   Erythromycin Anaphylaxis   Adhesive  [tape] Other (See Comments)   Skin peels easily   Benadryl [diphenhydramine]    Skin crawling feeling    Sulfa Antibiotics Other (See Comments)   Skin crawling feeling       Medication List    STOP taking these medications   naproxen 500 MG tablet Commonly known as:  NAPROSYN   oxyCODONE-acetaminophen 5-325 MG tablet Commonly known as:  PERCOCET/ROXICET     TAKE these medications   acetaminophen 500 MG tablet Commonly known as:  TYLENOL Take 1,000 mg by mouth every 6 (six) hours as needed for mild pain or headache.   atenolol 50 MG tablet Commonly known as:  TENORMIN Take 100 mg by mouth 2 (two) times daily.   buPROPion 150 MG 12 hr tablet Commonly known as:  WELLBUTRIN SR Take 300 mg by mouth daily.   meloxicam 15 MG tablet Commonly known as:  MOBIC Take 15 mg by mouth daily.   PROVENTIL HFA 108 (90 Base) MCG/ACT inhaler Generic drug:  albuterol Inhale 2 puffs into the lungs 4 (four) times daily as needed for shortness of breath.   sertraline 100 MG tablet Commonly known as:  ZOLOFT Take 100 mg by mouth every morning.   tiZANidine 4 MG tablet Commonly known as:  ZANAFLEX Take 2 mg by mouth at bedtime.   VITAMIN D BOOSTER PO Take 2,000 tablets by mouth 2 (two) times daily.        Acute coronary syndrome (MI, NSTEMI, STEMI, etc) this admission?: No.    Outstanding Labs/Studies   None   Duration of Discharge Encounter   Greater than 30 minutes including physician time.  Signed, Robbie LisBrittainy Phynix Horton, PA-C 10/04/2018, 4:12 PM

## 2018-10-04 NOTE — Care Management Note (Signed)
Case Management Note  Patient Details  Name: Barbara Haynes MRN: [id number] Date of Birth: [birth year]  Subjective/Objective:  Pt presented for Chest Pain. Pt has PCP Lavinia Sharps NP at [facility] in [place]. Pt usually gets medications from [facility], [facility]- PTA Independent from home. Patient has support of fiance. 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                   Action/Plan: CM will see if she needs MATCH once stable to transition home. No further needs identified by CM at this time.   Expected Discharge Date:                  Expected Discharge Plan:  Home/Self Care  In-House Referral:  NA  Discharge planning Services  CM Consult  Post Acute Care Choice:  NA Choice offered to:  NA  DME Arranged:  N/A DME Agency:  NA  HH Arranged:  NA HH Agency:  NA  Status of Service:  Completed, signed off  If discussed at Long Length of Stay Meetings, dates discussed:    Additional Comments:  Gala Lewandowsky, RN 10/04/2018, 11:27 AM

## 2018-10-04 NOTE — Progress Notes (Signed)
  Echocardiogram 2D Echocardiogram has been performed.  Barbara Haynes 10/04/2018, 4:10 PM

## 2020-02-14 ENCOUNTER — Other Ambulatory Visit: Payer: Self-pay

## 2020-02-14 DIAGNOSIS — Z1231 Encounter for screening mammogram for malignant neoplasm of breast: Secondary | ICD-10-CM

## 2020-03-21 ENCOUNTER — Other Ambulatory Visit: Payer: Self-pay

## 2020-03-21 ENCOUNTER — Ambulatory Visit: Payer: Self-pay | Admitting: *Deleted

## 2020-03-21 ENCOUNTER — Ambulatory Visit
Admission: RE | Admit: 2020-03-21 | Discharge: 2020-03-21 | Disposition: A | Discharge status: Home / Self Care | Payer: Self-pay | Source: Ambulatory Visit | Attending: *Deleted | Admitting: *Deleted

## 2020-03-21 VITALS — BP 118/78 | Temp 97.7°F | Wt 173.6 lb

## 2020-03-21 DIAGNOSIS — Z1239 Encounter for other screening for malignant neoplasm of breast: Secondary | ICD-10-CM

## 2020-03-21 DIAGNOSIS — Z1231 Encounter for screening mammogram for malignant neoplasm of breast: Secondary | ICD-10-CM

## 2020-03-21 NOTE — Patient Instructions (Addendum)
Explained breast self awareness with [name]. Patient did not need a Pap smear today due to last Pap smear was 02/16/2020 per patient.  Let her know [facility] will cover Pap smears every 3 years unless has a history of abnormal Pap smears. Referred patient to the [facility] for a screening mammogram on the mobile unit. Appointment scheduled Thursday, March 21, 2020 at 1150. Patient escorted to mobile unit following B[facility]CP appointment. Let patient know the Breast Center will follow up with her within the next couple weeks with results of her mammogram by letter or phone. Barbara Haynes verbalized understanding.  Barbara Haynes, Barbara Maser, RN 3:35 PM

## 2020-03-21 NOTE — Progress Notes (Signed)
[name] is a 59 y.o. female who presents to [facility] clinic today with no complaints.   Pap Smear: Pap not smear completed today. Last Pap smear was 02/16/2020 at [facility] clinic and was normal per patient. Per patient has a history of an abnormal Pap smear around 30 years ago that a repeat Pap smear was completed for follow-up. Per patient has had at least three normal Pap smears since abnormal. Last Pap smear result is not available in Epic.   Physical exam: Breasts Breasts symmetrical. No skin abnormalities bilateral breasts. No nipple retraction bilateral breasts. No nipple discharge bilateral breasts. No lymphadenopathy. No lumps palpated bilateral breasts. No complaints of pain or tenderness on exam.       Pelvic/Bimanual Pap is not indicated today    Smoking History: Patient has never smoked.   Patient Navigation: Patient education provided. Access to services provided for patient through [facility] program.   Colorectal Cancer Screening: Per patient has never had colonoscopy completed. No complaints today.    Breast and Cervical Cancer Risk Assessment: Patient does not have family history of breast cancer, known genetic mutations, or radiation treatment to the chest before age 72. Patient does not have history of cervical dysplasia, immunocompromised, or DES exposure in-utero.  Risk Assessment    Risk Scores      03/21/2020   Last edited by: Meryl Dare, CMA   5-year risk: 1.5 %   Lifetime risk: 8.5 %          A: [facility] exam without pap smear No complaints.  P: Referred patient to the [facility] for a screening mammogram on the mobile unit. Appointment scheduled Thursday, March 21, 2020 at 1150.  Priscille Heidelberg, RN 03/21/2020 3:35 PM

## 2020-05-28 ENCOUNTER — Encounter (HOSPITAL_COMMUNITY): Payer: Self-pay | Admitting: Psychiatry

## 2020-05-28 ENCOUNTER — Other Ambulatory Visit: Payer: Self-pay

## 2020-05-28 ENCOUNTER — Ambulatory Visit (INDEPENDENT_AMBULATORY_CARE_PROVIDER_SITE_OTHER): Payer: No Payment, Other | Admitting: Psychiatry

## 2020-05-28 VITALS — BP 103/61 | HR 75 | Ht 62.5 in | Wt 158.0 lb

## 2020-05-28 DIAGNOSIS — F331 Major depressive disorder, recurrent, moderate: Secondary | ICD-10-CM | POA: Insufficient documentation

## 2020-05-28 MED ORDER — SERTRALINE HCL 100 MG PO TABS: 100.0000 mg | ORAL_TABLET | Freq: Every morning | ORAL | 0 refills | Status: DC

## 2020-05-28 MED ORDER — BUPROPION HCL ER (SR) 150 MG PO TB12: 150.0000 mg | ORAL_TABLET | Freq: Two times a day (BID) | ORAL | 0 refills | Status: DC

## 2020-05-28 NOTE — Progress Notes (Addendum)
Psychiatric Initial Adult Assessment   Patient Identification: Barbara Haynes MRN:  [id number] Date of Evaluation:  05/28/2020   Referral Source: [facility] [place]  Chief Complaint:   " I have a lot of stress in my life."  Visit Diagnosis:    ICD-10-CM   1. Moderate episode of recurrent major depressive disorder (HCC)  F33.1     History of Present Illness: This is a 59 year old female with history of depression who was seen for evaluation today.  Patient reported that she was seen different providers at family services at [facility] however after they left she did not have anyone to fill the prescriptions.  She stated that she went without any medications for about a year however due to ongoing stressors she requested her current primary care provider to fill prescriptions for her.  The PCP gave her prescriptions for sertraline 50 mg daily and Wellbutrin SR 150 mg twice daily. Patient stated that she was taking higher dose of sertraline at 100 mg in the past and she really wants to get back on the dose. Patient reported numerous stressors in her life.  She stated that she is living with her partner and they both are struggling financially.  They have leaking move and they do not have the money to get it fixed.  She also reported that her partner has numerous health conditions and all that adds to her stress.  She informed that her daughter lives close by and is moving in with them closer to them very soon however she is concerned because both her daughter and her daughter's partner do not believe in vaccination and do not wear mask in public.  She does not want them to bring infections to her. She also informed that she was recently diagnosed with stage III kidney disease which she likely suspects is secondary to chronic NSAID use for arthritis.  She stated that she is yet to see her primary care for a plan regarding that.  She denied any history of hypertension or diabetes mellitus. She also  expressed frustration about how her PCP will not write refills for her and she has to see her every month in order to get further refills. She stated that she does not feel she is getting the right kind of medical care that she deserves but secondary to lack of insurance and financial difficulties she is unable to see anyone else. She endorses depressed mood with tearfulness, poor concentration, poor appetite. She denied any manic or hypomanic symptoms.  She denied any psychotic symptoms.   Past Psychiatric History: MDD, anxiety  Previous Psychotropic Medications: Yes   Substance Abuse History in the last 12 months:  No.  Consequences of Substance Abuse: NA  Past Medical History:  Past Medical History:  Diagnosis Date  . Fibromyalgia   . Hypoglycemia   . Migraines   . SVT (supraventricular tachycardia) (HCC)     Past Surgical History:  Procedure Laterality Date  . TONSILLECTOMY      Family Psychiatric History: denied  Family History:  Family History  Problem Relation Age of Onset  . Supraventricular tachycardia Mother   . Supraventricular tachycardia Other     Social History:   Social History   Socioeconomic History  . Marital status: Single    Spouse name: Not on file  . Number of children: Not on file  . Years of education: Not on file  . Highest education level: Some college, no degree  Occupational History  . Not on  file  Tobacco Use  . Smoking status: Never Smoker  . Smokeless tobacco: Never Used  Vaping Use  . Vaping Use: Never used  Substance and Sexual Activity  . Alcohol use: No  . Drug use: No  . Sexual activity: Yes    Birth control/protection: None  Other Topics Concern  . Not on file  Social History Narrative  . Not on file   Social Determinants of Health   Financial Resource Strain:   . Difficulty of Paying Living Expenses: Not on file  Food Insecurity:   . Worried About Programme researcher, broadcasting/film/video in the Last Year: Not on file  . Ran Out of  Food in the Last Year: Not on file  Transportation Needs: No Transportation Needs  . Lack of Transportation (Medical): No  . Lack of Transportation (Non-Medical): No  Physical Activity:   . Days of Exercise per Week: Not on file  . Minutes of Exercise per Session: Not on file  Stress:   . Feeling of Stress : Not on file  Social Connections:   . Frequency of Communication with Friends and Family: Not on file  . Frequency of Social Gatherings with Friends and Family: Not on file  . Attends Religious Services: Not on file  . Active Member of Clubs or Organizations: Not on file  . Attends Banker Meetings: Not on file  . Marital Status: Not on file    Additional Social History: Lives with her boyfriend, unemployed  Allergies:   Allergies  Allergen Reactions  . Erythromycin Anaphylaxis  . Lisinopril Anaphylaxis and Swelling    Tongue swelling and lips tingling and throat feels scratchy  . Adhesive [Tape] Other (See Comments)    Skin peels easily  . Benadryl [Diphenhydramine]     Skin crawling feeling   . Sulfa Antibiotics Other (See Comments)    Skin crawling feeling     Metabolic Disorder Labs: Lab Results  Component Value Date   HGBA1C 5.1 10/04/2018   MPG 99.67 10/04/2018   No results found for: PROLACTIN Lab Results  Component Value Date   CHOL 206 (H) 10/04/2018   TRIG 292 (H) 10/04/2018   HDL 36 (L) 10/04/2018   CHOLHDL 5.7 10/04/2018   VLDL 58 (H) 10/04/2018   LDLCALC 112 (H) 10/04/2018   No results found for: TSH  Therapeutic Level Labs: No results found for: LITHIUM No results found for: CBMZ No results found for: VALPROATE  Current Medications: Current Outpatient Medications  Medication Sig Dispense Refill  . acetaminophen (TYLENOL) 500 MG tablet Take 1,000 mg by mouth every 6 (six) hours as needed for mild pain or headache. (Patient not taking: Reported on 03/21/2020)    . atenolol (TENORMIN) 50 MG tablet Take 100 mg by mouth 2 (two)  times daily.    Marland Kitchen buPROPion (WELLBUTRIN SR) 150 MG 12 hr tablet Take 300 mg by mouth daily. (Patient not taking: Reported on 03/21/2020)    . losartan (COZAAR) 25 MG tablet Take 25 mg by mouth daily.    . meloxicam (MOBIC) 15 MG tablet Take 15 mg by mouth daily. (Patient not taking: Reported on 03/21/2020)    . Nutritional Supplements (VITAMIN D BOOSTER PO) Take 2,000 tablets by mouth 2 (two) times daily. (Patient not taking: Reported on 03/21/2020)    . omeprazole (PRILOSEC) 10 MG capsule Take 10 mg by mouth daily.    Marland Kitchen PROVENTIL HFA 108 (90 Base) MCG/ACT inhaler Inhale 2 puffs into the lungs 4 (four) times  daily as needed for shortness of breath.    . sertraline (ZOLOFT) 100 MG tablet Take 100 mg by mouth every morning. (Patient not taking: Reported on 03/21/2020)    . tiZANidine (ZANAFLEX) 4 MG tablet Take 2 mg by mouth at bedtime. (Patient not taking: Reported on 03/21/2020)     No current facility-administered medications for this visit.    Musculoskeletal: Strength & Muscle Tone: within normal limits Gait & Station: normal Patient leans: N/A  Psychiatric Specialty Exam: Review of Systems  There were no vitals taken for this visit.There is no height or weight on file to calculate BMI.  General Appearance: Fairly Groomed  Eye Contact:  Good  Speech:  Clear and Coherent and Normal Rate  Volume:  Normal  Mood:  Anxious  Affect:  Congruent  Thought Process:  Goal Directed and Descriptions of Associations: Intact  Orientation:  Full (Time, Place, and Person)  Thought Content:  Logical  Suicidal Thoughts:  No  Homicidal Thoughts:  No  Memory:  Immediate;   Good Recent;   Good  Judgement:  Fair  Insight:  Fair  Psychomotor Activity:  Normal  Concentration:  Concentration: Good and Attention Span: Good  Recall:  Good  Fund of Knowledge:Good  Language: Good  Akathisia:  Negative  Handed:  Right  AIMS (if indicated):  not done  Assets:  Communication Skills Desire for  Improvement Financial Resources/Insurance Housing  ADL's:  Intact  Cognition: WNL  Sleep:  Fair      Assessment and Plan: Patient seen with ongoing depressive symptoms secondary to numerous life stressors.  She is already established with a therapist at [facility] [place] and is planning to continue counseling with them.  She was agreeable with the plan of going up on the dose of Zoloft for optimal effect. VS: Blood pressure 103/61, pulse 75, height 5' 2.5" (1.588 m), weight 158 lb (71.7 kg), SpO2 98 %.   1. Moderate episode of recurrent major depressive disorder (HCC)  - Increase sertraline (ZOLOFT) 100 MG tablet; Take 1 tablet (100 mg total) by mouth in the morning.  Dispense: 90 tablet; Refill: 0 - buPROPion (WELLBUTRIN SR) 150 MG 12 hr tablet; Take 1 tablet (150 mg total) by mouth 2 (two) times daily.  Dispense: 180 tablet; Refill: 0  Follow-up in 2 months. Continue therapy with counselor at [facility].  Zena Amos, MD 10/5/20213:50 PM

## 2020-07-25 ENCOUNTER — Ambulatory Visit (HOSPITAL_COMMUNITY): Payer: No Payment, Other | Admitting: Psychiatry

## 2020-08-29 ENCOUNTER — Other Ambulatory Visit: Payer: Self-pay

## 2020-08-29 ENCOUNTER — Telehealth (INDEPENDENT_AMBULATORY_CARE_PROVIDER_SITE_OTHER): Payer: No Payment, Other | Admitting: Psychiatry

## 2020-08-29 ENCOUNTER — Encounter (HOSPITAL_COMMUNITY): Payer: Self-pay | Admitting: Psychiatry

## 2020-08-29 DIAGNOSIS — F331 Major depressive disorder, recurrent, moderate: Secondary | ICD-10-CM | POA: Diagnosis not present

## 2020-08-29 DIAGNOSIS — F3341 Major depressive disorder, recurrent, in partial remission: Secondary | ICD-10-CM | POA: Diagnosis not present

## 2020-08-29 MED ORDER — SERTRALINE HCL 100 MG PO TABS: 100.0000 mg | ORAL_TABLET | Freq: Every morning | ORAL | 1 refills | Status: DC

## 2020-08-29 MED ORDER — BUPROPION HCL ER (SR) 150 MG PO TB12: 150.0000 mg | ORAL_TABLET | Freq: Two times a day (BID) | ORAL | 1 refills | Status: DC

## 2020-08-29 NOTE — Progress Notes (Signed)
BH OP Progress Note   Virtual Visit via Video Note  I connected with Barbara Haynes on 08/29/20 at 10:00 AM EST by a video enabled telemedicine application and verified that I am speaking with the correct person using two identifiers.  Location: Patient: Home Provider: Clinic   I discussed the limitations of evaluation and management by telemedicine and the availability of in person appointments. The patient expressed understanding and agreed to proceed.  I provided 17 minutes of non-face-to-face time during this encounter.     Patient Identification: Barbara Haynes MRN:  [id number] Date of Evaluation:  08/29/2020    Chief Complaint:   "The medicines are helping a lot."  Visit Diagnosis:    ICD-10-CM   1. Recurrent major depressive disorder, in partial remission (HCC)  F33.41     History of Present Illness: Patient reported that she is doing better because despite having all the stressors she is dealing with them in a much better way.  She stated that the medicines help her get her mind off all the stress.  She is not overthinking about everything now. She described in great detail how her daughter and daughter's boyfriend refused to get vaccinated against Covid and she feels very vulnerable when she goes to their house every day to watch there 3 young kids all below the age of 29.  She stated that she has spoken to them several times but nothing seems to be helpful.  She stated that recently her daughter's boyfriend developed some cold-like symptoms and patient is refusing to go to watch her daughter's children in her house now because she wants them to isolate completely. She also expressed frustration about her PCP wanting her to come to the office every month because she will only dispensed 30-day prescriptions at a time without any refills.  She stated that the PCP expects the patient is to be coming to see her every 30 days but her next appointment is not available until 5 to 6 weeks  later which means she has to run out of her medicines for the last couple of weeks and she has to make them stretch. She stated that she was seeing a therapist however due to insurance issues she can no longer see them and she had to switch to see a different therapist which is also requirement of the clinic in order for the patient to continue getting treatment from them.  She stated that she did not like the new therapist and she felt that therapist made her even more overwhelmed and guilty because of the way they dealt with the past issues she has had.  She informed that she has 7 cats in her house currently and she enjoys taking care of them.  She described in detail each and every one of them and how she got them.  She informed that her creatinine is still elevated and her boyfriend is still dealing with his health issues.  They state home pretty much all the time and she is no longer going to her daughter's house for the next few weeks now.   Past Psychiatric History: MDD, anxiety  Previous Psychotropic Medications: Yes   Substance Abuse History in the last 12 months:  No.  Consequences of Substance Abuse: NA  Past Medical History:  Past Medical History:  Diagnosis Date  . Fibromyalgia   . Hypoglycemia   . Migraines   . SVT (supraventricular tachycardia) (HCC)     Past Surgical History:  Procedure Laterality Date  .  TONSILLECTOMY      Family Psychiatric History: denied  Family History:  Family History  Problem Relation Age of Onset  . Supraventricular tachycardia Mother   . Supraventricular tachycardia Other     Social History:   Social History   Socioeconomic History  . Marital status: Single    Spouse name: Not on file  . Number of children: Not on file  . Years of education: Not on file  . Highest education level: Some college, no degree  Occupational History  . Not on file  Tobacco Use  . Smoking status: Never Smoker  . Smokeless tobacco: Never Used   Vaping Use  . Vaping Use: Never used  Substance and Sexual Activity  . Alcohol use: No  . Drug use: No  . Sexual activity: Yes    Birth control/protection: None  Other Topics Concern  . Not on file  Social History Narrative  . Not on file   Social Determinants of Health   Financial Resource Strain: Not on file  Food Insecurity: Not on file  Transportation Needs: No Transportation Needs  . Lack of Transportation (Medical): No  . Lack of Transportation (Non-Medical): No  Physical Activity: Not on file  Stress: Not on file  Social Connections: Not on file    Additional Social History: Lives with her boyfriend, unemployed  Allergies:   Allergies  Allergen Reactions  . Erythromycin Anaphylaxis  . Lisinopril Anaphylaxis and Swelling    Tongue swelling and lips tingling and throat feels scratchy  . Adhesive [Tape] Other (See Comments)    Skin peels easily  . Benadryl [Diphenhydramine]     Skin crawling feeling   . Sulfa Antibiotics Other (See Comments)    Skin crawling feeling     Metabolic Disorder Labs: Lab Results  Component Value Date   HGBA1C 5.1 10/04/2018   MPG 99.67 10/04/2018   No results found for: PROLACTIN Lab Results  Component Value Date   CHOL 206 (H) 10/04/2018   TRIG 292 (H) 10/04/2018   HDL 36 (L) 10/04/2018   CHOLHDL 5.7 10/04/2018   VLDL 58 (H) 10/04/2018   LDLCALC 112 (H) 10/04/2018   No results found for: TSH  Therapeutic Level Labs: No results found for: LITHIUM No results found for: CBMZ No results found for: VALPROATE  Current Medications: Current Outpatient Medications  Medication Sig Dispense Refill  . acetaminophen (TYLENOL) 500 MG tablet Take 1,000 mg by mouth every 6 (six) hours as needed for mild pain or headache.     Marland Kitchen atenolol (TENORMIN) 50 MG tablet Take 100 mg by mouth 2 (two) times daily.    Marland Kitchen buPROPion (WELLBUTRIN SR) 150 MG 12 hr tablet Take 1 tablet (150 mg total) by mouth 2 (two) times daily. 180 tablet 0  .  losartan (COZAAR) 25 MG tablet Take 25 mg by mouth daily.    . meloxicam (MOBIC) 15 MG tablet Take 15 mg by mouth daily.     . Nutritional Supplements (VITAMIN D BOOSTER PO) Take 2,000 tablets by mouth 2 (two) times daily.     Marland Kitchen omeprazole (PRILOSEC) 10 MG capsule Take 10 mg by mouth daily.    Marland Kitchen PROVENTIL HFA 108 (90 Base) MCG/ACT inhaler Inhale 2 puffs into the lungs 4 (four) times daily as needed for shortness of breath.    . sertraline (ZOLOFT) 100 MG tablet Take 1 tablet (100 mg total) by mouth in the morning. 90 tablet 0  . tiZANidine (ZANAFLEX) 4 MG tablet Take 2 mg  by mouth at bedtime.      No current facility-administered medications for this visit.    Musculoskeletal: Strength & Muscle Tone: within normal limits Gait & Station: normal Patient leans: N/A  Psychiatric Specialty Exam: Review of Systems  There were no vitals taken for this visit.There is no height or weight on file to calculate BMI.  General Appearance: Fairly Groomed  Eye Contact:  Good  Speech:  Clear and Coherent and Normal Rate  Volume:  Normal  Mood:  Euthymic  Affect:  Congruent  Thought Process:  Goal Directed and Descriptions of Associations: Intact  Orientation:  Full (Time, Place, and Person)  Thought Content:  Logical  Suicidal Thoughts:  No  Homicidal Thoughts:  No  Memory:  Immediate;   Good Recent;   Good  Judgement:  Fair  Insight:  Fair  Psychomotor Activity:  Normal  Concentration:  Concentration: Good and Attention Span: Good  Recall:  Good  Fund of Knowledge:Good  Language: Good  Akathisia:  Negative  Handed:  Right  AIMS (if indicated):  not done  Assets:  Communication Skills Desire for Improvement Financial Resources/Insurance Housing  ADL's:  Intact  Cognition: WNL  Sleep:  Fair      Assessment and Plan: Patient reported improvement in her depression and anxiety symptoms since getting back on the right doses of Wellbutrin and sertraline.  She is dealing with some  chronic stressors.   1. Recurrent major depressive disorder, in partial remission (HCC)  - sertraline (ZOLOFT) 100 MG tablet; Take 1 tablet (100 mg total) by mouth in the morning.  Dispense: 90 tablet; Refill: 1 - buPROPion (WELLBUTRIN SR) 150 MG 12 hr tablet; Take 1 tablet (150 mg total) by mouth 2 (two) times daily.  Dispense: 180 tablet; Refill: 1  Follow-up in 3 months. Continue same medications. Continue therapy with counselor at [facility] clinic.  Nev[place] Crane, MD 1/6/202210:11 AM

## 2020-11-25 ENCOUNTER — Encounter (HOSPITAL_COMMUNITY): Payer: Self-pay | Admitting: Psychiatry

## 2020-11-25 ENCOUNTER — Other Ambulatory Visit: Payer: Self-pay

## 2020-11-25 ENCOUNTER — Telehealth (INDEPENDENT_AMBULATORY_CARE_PROVIDER_SITE_OTHER): Payer: No Payment, Other | Admitting: Psychiatry

## 2020-11-25 DIAGNOSIS — F3341 Major depressive disorder, recurrent, in partial remission: Secondary | ICD-10-CM

## 2020-11-25 MED ORDER — SERTRALINE HCL 100 MG PO TABS: 100.0000 mg | ORAL_TABLET | Freq: Every morning | ORAL | 1 refills | Status: AC

## 2020-11-25 MED ORDER — BUPROPION HCL ER (SR) 150 MG PO TB12: 150.0000 mg | ORAL_TABLET | Freq: Two times a day (BID) | ORAL | 1 refills | Status: AC

## 2020-11-25 NOTE — Progress Notes (Signed)
BH OP Progress Note     Virtual Visit via Video Note  I connected with [name] on 11/25/20 at 11:20 AM EDT by a video enabled telemedicine application and verified that I am speaking with the correct person using two identifiers.  Location: Patient: Home Provider: Clinic   I discussed the limitations of evaluation and management by telemedicine and the availability of in person appointments. The patient expressed understanding and agreed to proceed.  I provided 16 minutes of non-face-to-face time during this encounter.     Patient Identification: Marlet Korte MRN:  [id number] Date of Evaluation:  11/25/2020    Chief Complaint:   "I am doing okay."  Visit Diagnosis:    ICD-10-CM   1. Recurrent major depressive disorder, in partial remission (HCC)  F33.41     History of Present Illness: Patient follows she is doing fairly well overall.  She informed that her daughter is expecting her fourth baby now.  She stated that it is a lot because her oldest grandson is not even turned 5 yet.  She stated that her daughter and her significant other are quite excited. She has been going out to help her daughter with her children.  She mentioned that her 47-year-old grandson has been receiving occupational therapy and speech therapy and has recently started saying the word grandmother.  He is making good progress and that is very pleasing to the family. Patient stated that overall her mood has been stable.  She stated that her significant other was working on getting a major procedure scheduled for the fistula and he finally has a surgery date scheduled for the same.  She mentioned that her therapist that she had [facility] [place] [facility] the [facility] is now leaving the practice.  She stated that they are connecting her to a different therapist and she is not too keen on doing that.  She requested the writer to with a therapist in this office.  She is still planning to  continue seeing the PCP despite the fact that she has to go there every month to pick up her prescriptions because the PCP will not write refills.   Past Psychiatric History: MDD, anxiety  Previous Psychotropic Medications: Yes   Substance Abuse History in the last 12 months:  No.  Consequences of Substance Abuse: NA  Past Medical History:  Past Medical History:  Diagnosis Date  . Fibromyalgia   . Hypoglycemia   . Migraines   . SVT (supraventricular tachycardia) (HCC)     Past Surgical History:  Procedure Laterality Date  . TONSILLECTOMY      Family Psychiatric History: denied  Family History:  Family History  Problem Relation Age of Onset  . Supraventricular tachycardia Mother   . Supraventricular tachycardia Other     Social History:   Social History   Socioeconomic History  . Marital status: Single    Spouse name: Not on file  . Number of children: Not on file  . Years of education: Not on file  . Highest education level: Some college, no degree  Occupational History  . Not on file  Tobacco Use  . Smoking status: Never Smoker  . Smokeless tobacco: Never Used  Vaping Use  . Vaping Use: Never used  Substance and Sexual Activity  . Alcohol use: No  . Drug use: No  . Sexual activity: Yes    Birth control/protection: None  Other Topics Concern  . Not on file  Social  History Narrative  . Not on file   Social Determinants of Health   Financial Resource Strain: Not on file  Food Insecurity: Not on file  Transportation Needs: No Transportation Needs  . Lack of Transportation (Medical): No  . Lack of Transportation (Non-Medical): No  Physical Activity: Not on file  Stress: Not on file  Social Connections: Not on file    Additional Social History: Lives with her boyfriend, unemployed  Allergies:   Allergies  Allergen Reactions  . Erythromycin Anaphylaxis  . Lisinopril Anaphylaxis and Swelling    Tongue swelling and lips tingling and throat feels  scratchy  . Adhesive [Tape] Other (See Comments)    Skin peels easily  . Benadryl [Diphenhydramine]     Skin crawling feeling   . Sulfa Antibiotics Other (See Comments)    Skin crawling feeling     Metabolic Disorder Labs: Lab Results  Component Value Date   HGBA1C 5.1 10/04/2018   MPG 99.67 10/04/2018   No results found for: PROLACTIN Lab Results  Component Value Date   CHOL 206 (H) 10/04/2018   TRIG 292 (H) 10/04/2018   HDL 36 (L) 10/04/2018   CHOLHDL 5.7 10/04/2018   VLDL 58 (H) 10/04/2018   LDLCALC 112 (H) 10/04/2018   No results found for: TSH  Therapeutic Level Labs: No results found for: LITHIUM No results found for: CBMZ No results found for: VALPROATE  Current Medications: Current Outpatient Medications  Medication Sig Dispense Refill  . acetaminophen (TYLENOL) 500 MG tablet Take 1,000 mg by mouth every 6 (six) hours as needed for mild pain or headache.     Marland Kitchen atenolol (TENORMIN) 50 MG tablet Take 100 mg by mouth 2 (two) times daily.    Marland Kitchen buPROPion (WELLBUTRIN SR) 150 MG 12 hr tablet Take 1 tablet (150 mg total) by mouth 2 (two) times daily. 180 tablet 1  . losartan (COZAAR) 25 MG tablet Take 25 mg by mouth daily.    . meloxicam (MOBIC) 15 MG tablet Take 15 mg by mouth daily.     . Nutritional Supplements (VITAMIN D BOOSTER PO) Take 2,000 tablets by mouth 2 (two) times daily.     Marland Kitchen omeprazole (PRILOSEC) 10 MG capsule Take 10 mg by mouth daily.    Marland Kitchen PROVENTIL HFA 108 (90 Base) MCG/ACT inhaler Inhale 2 puffs into the lungs 4 (four) times daily as needed for shortness of breath.    . sertraline (ZOLOFT) 100 MG tablet Take 1 tablet (100 mg total) by mouth in the morning. 90 tablet 1  . tiZANidine (ZANAFLEX) 4 MG tablet Take 2 mg by mouth at bedtime.      No current facility-administered medications for this visit.    Musculoskeletal: Strength & Muscle Tone: within normal limits Gait & Station: normal Patient leans: N/A  Psychiatric Specialty Exam: Review of  Systems  There were no vitals taken for this visit.There is no height or weight on file to calculate BMI.  General Appearance: Fairly Groomed  Eye Contact:  Good  Speech:  Clear and Coherent and Normal Rate  Volume:  Normal  Mood:  Euthymic  Affect:  Congruent  Thought Process:  Goal Directed and Descriptions of Associations: Intact  Orientation:  Full (Time, Place, and Person)  Thought Content:  Logical  Suicidal Thoughts:  No  Homicidal Thoughts:  No  Memory:  Immediate;   Good Recent;   Good  Judgement:  Fair  Insight:  Fair  Psychomotor Activity:  Normal  Concentration:  Concentration: Good  and Attention Span: Good  Recall:  Good  Fund of Knowledge:Good  Language: Good  Akathisia:  Negative  Handed:  Right  AIMS (if indicated):  not done  Assets:  Communication Skills Desire for Improvement Financial Resources/Insurance Housing  ADL's:  Intact  Cognition: WNL  Sleep:  Fair      Assessment and Plan: Patient is doing fairly well for now.  1. Recurrent major depressive disorder, in partial remission (HCC)  - sertraline (ZOLOFT) 100 MG tablet; Take 1 tablet (100 mg total) by mouth in the morning.  Dispense: 90 tablet; Refill: 1 - buPROPion (WELLBUTRIN SR) 150 MG 12 hr tablet; Take 1 tablet (150 mg total) by mouth 2 (two) times daily.  Dispense: 180 tablet; Refill: 1  Follow-up in 3 months. Continue same medications. We will connect her with family therapist in the office.    Zena Amos, MD 4/4/202211:20 AM

## 2021-01-15 ENCOUNTER — Ambulatory Visit (HOSPITAL_COMMUNITY): Payer: No Payment, Other | Admitting: Licensed Clinical Social Worker

## 2021-01-15 ENCOUNTER — Telehealth (HOSPITAL_COMMUNITY): Payer: Self-pay | Admitting: Licensed Clinical Social Worker

## 2021-01-15 NOTE — Telephone Encounter (Signed)
Pt called 01/14/21 LVM to cancel appt with Venora Maples on 01/15/21. Please void no show for 01/15/21.

## 2021-02-18 ENCOUNTER — Other Ambulatory Visit: Payer: Self-pay

## 2021-02-18 ENCOUNTER — Encounter (HOSPITAL_COMMUNITY): Payer: No Payment, Other | Admitting: Psychiatry

## 2021-02-18 NOTE — Progress Notes (Signed)
error 

## 2021-02-24 ENCOUNTER — Emergency Department (HOSPITAL_COMMUNITY)
Admission: EM | Admit: 2021-02-24 | Discharge: 2021-02-24 | Disposition: A | Discharge status: Home / Self Care | Payer: No Typology Code available for payment source | Attending: Emergency Medicine | Admitting: Emergency Medicine

## 2021-02-24 ENCOUNTER — Encounter (HOSPITAL_COMMUNITY): Payer: Self-pay | Admitting: Emergency Medicine

## 2021-02-24 ENCOUNTER — Other Ambulatory Visit: Payer: Self-pay

## 2021-02-24 DIAGNOSIS — Z23 Encounter for immunization: Secondary | ICD-10-CM | POA: Insufficient documentation

## 2021-02-24 DIAGNOSIS — S01112A Laceration without foreign body of left eyelid and periocular area, initial encounter: Secondary | ICD-10-CM | POA: Insufficient documentation

## 2021-02-24 DIAGNOSIS — W5503XA Scratched by cat, initial encounter: Secondary | ICD-10-CM | POA: Insufficient documentation

## 2021-02-24 DIAGNOSIS — Y93K9 Activity, other involving animal care: Secondary | ICD-10-CM | POA: Insufficient documentation

## 2021-02-24 MED ORDER — AMOXICILLIN-POT CLAVULANATE 875-125 MG PO TABS
1.0000 | ORAL_TABLET | Freq: Once | ORAL | Status: AC
  Administered 2021-02-24: 1 via ORAL
  Filled 2021-02-24: qty 1

## 2021-02-24 MED ORDER — AMOXICILLIN-POT CLAVULANATE 875-125 MG PO TABS: 1.0000 | ORAL_TABLET | Freq: Two times a day (BID) | ORAL | 0 refills | Status: DC

## 2021-02-24 MED ORDER — TETANUS-DIPHTH-ACELL PERTUSSIS 5-2.5-18.5 LF-MCG/0.5 IM SUSY
0.5000 mL | PREFILLED_SYRINGE | Freq: Once | INTRAMUSCULAR | Status: AC
  Administered 2021-02-24: 0.5 mL via INTRAMUSCULAR
  Filled 2021-02-24: qty 0.5

## 2021-02-24 NOTE — ED Triage Notes (Signed)
Pt reports being scratched to her left eye lid from her cat. Cat vaccinations up to date, denies vision changes to eye.

## 2021-02-24 NOTE — ED Notes (Signed)
Pt discharged and ambulated out of the ED without difficulty. 

## 2021-02-24 NOTE — ED Notes (Signed)
Wound irrigated with saline solution and patted dry.

## 2021-02-24 NOTE — ED Provider Notes (Signed)
[facility] EMERGENCY DEPARTMENT Provider Note   CSN: [id number] Arrival date & time: 02/24/21  1608     History Chief Complaint  Patient presents with   Cat Scratch    Barbara Haynes is a 60 y.o. female with past medical history significant for fibromyalgia, hypoglycemia, migraines, SVT.  HPI Patient presents to emergency department today with chief complaint of cat scratch.  This happened just prior to arrival.  She states she was holding her cat and trying to give him medicine when he accidentally scratched her left eyelid.  She has pain localized to the wound.  She has been applying a bandage which has controlled the bleeding.  She denies any pain to her eye, no visual changes.  Cat is up-to-date on rabies vaccine.  Patient states her tetanus is not up-to-date.  No medications for symptoms prior to arrival.  Patient was wearing glasses when this happened.  Denies any fever, chills, blurry vision or eye drainage.    Past Medical History:  Diagnosis Date   Fibromyalgia    Hypoglycemia    Migraines    SVT (supraventricular tachycardia) [facility])     Patient Active Problem List   Diagnosis Date Noted   Erroneous encounter - disregard 02/18/2021   Recurrent major depressive disorder, in partial remission (HCC) 11/25/2020   Moderate episode of recurrent major depressive disorder (HCC) 05/28/2020   Chest pain 10/03/2018    Past Surgical History:  Procedure Laterality Date   TONSILLECTOMY       OB History     Gravida  3   Para      Term      Preterm      AB      Living         SAB      IAB      Ectopic      Multiple      Live Births  2           Family History  Problem Relation Age of Onset   Supraventricular tachycardia Mother    Supraventricular tachycardia Other     Social History   Tobacco Use   Smoking status: Never   Smokeless tobacco: Never  Vaping Use   Vaping Use: Never used  Substance Use Topics   Alcohol use: No    Drug use: No    Home Medications Prior to Admission medications   Medication Sig Start Date End Date Taking? Authorizing Provider  amoxicillin-clavulanate (AUGMENTIN) 875-125 MG tablet Take 1 tablet by mouth every 12 (twelve) hours. 02/24/21  Yes Walisiewicz, Reighlyn Elmes E, PA-C  acetaminophen (TYLENOL) 500 MG tablet Take 1,000 mg by mouth every 6 (six) hours as needed for mild pain or headache.     [provider]  atenolol (TENORMIN) 50 MG tablet Take 100 mg by mouth 2 (two) times daily.    [provider]  buPROPion (WELLBUTRIN SR) 150 MG 12 hr tablet Take 1 tablet (150 mg total) by mouth 2 (two) times daily. 11/25/20   Zena Amos, MD  losartan (COZAAR) 25 MG tablet Take 25 mg by mouth daily.    [provider]  meloxicam (MOBIC) 15 MG tablet Take 15 mg by mouth daily.  09/23/18   [provider]  Nutritional Supplements (VITAMIN D BOOSTER PO) Take 2,000 tablets by mouth 2 (two) times daily.     [provider]  omeprazole (PRILOSEC) 10 MG capsule Take 10 mg by mouth daily.  [provider]  PROVENTIL HFA 108 (90 Base) MCG/ACT inhaler Inhale 2 puffs into the lungs 4 (four) times daily as needed for shortness of breath. 09/23/18   [provider]  sertraline (ZOLOFT) 100 MG tablet Take 1 tablet (100 mg total) by mouth in the morning. 11/25/20   Zena Amos, MD  tiZANidine (ZANAFLEX) 4 MG tablet Take 2 mg by mouth at bedtime.     [provider]    Allergies    Erythromycin, Lisinopril, Adhesive [tape], Benadryl [diphenhydramine], and Sulfa antibiotics  Review of Systems   Review of Systems All other systems are reviewed and are negative for acute change except as noted in the HPI.  Physical Exam Updated Vital Signs BP 112/73 (BP Location: Right Arm)   Pulse (!) 59   Temp 98.7 F (37.1 C) (Oral)   Resp 16   SpO2 100%   Physical Exam Vitals and nursing note reviewed.  Constitutional:      Appearance: She is  well-developed. She is not ill-appearing or toxic-appearing.  HENT:     Head: Normocephalic and atraumatic.     Nose: Nose normal.  Eyes:     General: No scleral icterus.       Right eye: No discharge.        Left eye: No discharge.     Extraocular Movements: Extraocular movements intact.     Conjunctiva/sclera: Conjunctivae normal.     Left eye: Left conjunctiva is not injected. No chemosis, exudate or hemorrhage.    Pupils: Pupils are equal, round, and reactive to light.     Comments: 1.5 cm linear laceration to left upper eyelid.  Not full-thickness.  No active bleeding.  Neck:     Vascular: No JVD.  Cardiovascular:     Rate and Rhythm: Normal rate and regular rhythm.     Pulses: Normal pulses.     Heart sounds: Normal heart sounds.  Pulmonary:     Effort: Pulmonary effort is normal.     Breath sounds: Normal breath sounds.  Abdominal:     General: There is no distension.  Musculoskeletal:        General: Normal range of motion.     Cervical back: Normal range of motion.  Skin:    General: Skin is warm and dry.  Neurological:     Mental Status: She is oriented to person, place, and time.     GCS: GCS eye subscore is 4. GCS verbal subscore is 5. GCS motor subscore is 6.     Comments: Fluent speech, no facial droop.  Psychiatric:        Behavior: Behavior normal.    ED Results / Procedures / Treatments   Labs (all labs ordered are listed, but only abnormal results are displayed) Labs Reviewed - No data to display  EKG None  Radiology No results found.  Procedures Procedures   Medications Ordered in ED Medications  Tdap (BOOSTRIX) injection 0.5 mL (0.5 mLs Intramuscular Given 02/24/21 1922)  amoxicillin-clavulanate (AUGMENTIN) 875-125 MG per tablet 1 tablet (1 tablet Oral Given 02/24/21 1923)    ED Course  I have reviewed the triage vital signs and the nursing notes.  Pertinent labs & imaging results that were available during my care of the patient were  reviewed by me and considered in my medical decision making (see chart for details).    MDM Rules/Calculators/A&P  History provided by patient with additional history obtained from chart review.    Presenting after cat scratch to her left eyelid.  Laceration is not full-thickness.  No visual changes.  Pupils are equal and reactive.  No pain with EOMs. No evidence of globe rupture or eye injury. Patient's tetanus updated today.  Wound irrigated and Augmentin given.  Laceration is not gaping and edges are well approximated.  There is no active bleeding.  Discussed with ED attending Dr. Effie Shy who agrees with plan to not close secondary to high risk of infection.  Patient is agreeable plan of care.  Given first dose of Augmentin here and will discharge home with prescription for the same.  Recommend PCP follow-up in 2 days for wound check.  Strict return precautions discussed.  Patient has had medicine at home she will take if needed. Strict return precautions discussed. Patient agreeable with plan of care.    Portions of this note were generated with Scientist, clinical (histocompatibility and immunogenetics). Dictation errors may occur despite best attempts at proofreading.  Final Clinical Impression(s) / ED Diagnoses Final diagnoses:  Cat scratch    Rx / DC Orders ED Discharge Orders          Ordered    amoxicillin-clavulanate (AUGMENTIN) 875-125 MG tablet  Every 12 hours        02/24/21 1923             Shanon Ace, PA-C 02/24/21 1930    Mancel Bale, MD 02/24/21 2332

## 2021-02-24 NOTE — ED Provider Notes (Signed)
Emergency Medicine Provider Triage Evaluation Note  Akshaya Toepfer , a 60 y.o. female  was evaluated in triage.  Pt complains of cat scratch.  Patient reports just prior to arrival her cat scratched her on the left upper eyelid, cat is up-to-date on all vaccinations and patient's tetanus was updated in the last 4 to 5 years.  Still able to see normally.  No other scratches or injuries elsewhere.  Review of Systems  Positive: Laceration Negative: Vision changes  Physical Exam  BP 112/73 (BP Location: Right Arm)   Pulse (!) 59   Temp 98.7 F (37.1 C) (Oral)   Resp 16   SpO2 100%  Gen:   Awake, no distress   Resp:  Normal effort  MSK:   Moves extremities without difficulty  Other:  2 cm laceration to the left eyelid does not appear to be full-thickness, PERRLA, EOMI  Medical Decision Making  Medically screening exam initiated at 4:45 PM.  Appropriate orders placed.  Makinzy Cleere was informed that the remainder of the evaluation will be completed by another provider, this initial triage assessment does not replace that evaluation, and the importance of remaining in the ED until their evaluation is complete.     Dartha Lodge, PA-C 02/24/21 1650    Mancel Bale, MD 02/24/21 [phone]

## 2021-02-24 NOTE — Discharge Instructions (Addendum)
Your tetanus was updated today.  You were given your first dose of antibiotic Augmentin here in the ER.  Pick up the rest of the pharmacy and take it as prescribed.  Recommend you follow-up with your primary care doctor for wound check in 2 days.  If you notice any signs of infection return to the ER.

## 2021-03-05 ENCOUNTER — Other Ambulatory Visit: Payer: Self-pay | Admitting: *Deleted

## 2021-03-14 ENCOUNTER — Encounter (HOSPITAL_COMMUNITY)
Admission: EM | Disposition: A | Discharge status: Home / Self Care | Payer: Self-pay | Source: Home / Self Care | Attending: Emergency Medicine

## 2021-03-14 ENCOUNTER — Inpatient Hospital Stay: Admit: 2021-03-14 | Payer: No Typology Code available for payment source | Admitting: General Surgery

## 2021-03-14 ENCOUNTER — Observation Stay (HOSPITAL_COMMUNITY): Payer: No Typology Code available for payment source | Admitting: Certified Registered Nurse Anesthetist

## 2021-03-14 ENCOUNTER — Other Ambulatory Visit: Payer: Self-pay

## 2021-03-14 ENCOUNTER — Encounter (HOSPITAL_COMMUNITY): Payer: Self-pay

## 2021-03-14 ENCOUNTER — Ambulatory Visit (HOSPITAL_COMMUNITY)
Admission: EM | Admit: 2021-03-14 | Discharge: 2021-03-15 | Disposition: A | Discharge status: Home / Self Care | Payer: Self-pay | Attending: General Surgery | Admitting: General Surgery

## 2021-03-14 ENCOUNTER — Emergency Department (HOSPITAL_COMMUNITY): Payer: No Typology Code available for payment source

## 2021-03-14 ENCOUNTER — Observation Stay (HOSPITAL_COMMUNITY): Payer: No Typology Code available for payment source

## 2021-03-14 DIAGNOSIS — K810 Acute cholecystitis: Secondary | ICD-10-CM

## 2021-03-14 DIAGNOSIS — Z79899 Other long term (current) drug therapy: Secondary | ICD-10-CM | POA: Insufficient documentation

## 2021-03-14 DIAGNOSIS — R109 Unspecified abdominal pain: Secondary | ICD-10-CM | POA: Diagnosis present

## 2021-03-14 DIAGNOSIS — R1013 Epigastric pain: Secondary | ICD-10-CM

## 2021-03-14 DIAGNOSIS — I129 Hypertensive chronic kidney disease with stage 1 through stage 4 chronic kidney disease, or unspecified chronic kidney disease: Secondary | ICD-10-CM | POA: Insufficient documentation

## 2021-03-14 DIAGNOSIS — Z20822 Contact with and (suspected) exposure to covid-19: Secondary | ICD-10-CM | POA: Insufficient documentation

## 2021-03-14 DIAGNOSIS — K8012 Calculus of gallbladder with acute and chronic cholecystitis without obstruction: Secondary | ICD-10-CM | POA: Insufficient documentation

## 2021-03-14 DIAGNOSIS — Z882 Allergy status to sulfonamides status: Secondary | ICD-10-CM | POA: Insufficient documentation

## 2021-03-14 DIAGNOSIS — N1831 Chronic kidney disease, stage 3a: Secondary | ICD-10-CM | POA: Insufficient documentation

## 2021-03-14 DIAGNOSIS — Z888 Allergy status to other drugs, medicaments and biological substances status: Secondary | ICD-10-CM | POA: Insufficient documentation

## 2021-03-14 DIAGNOSIS — F419 Anxiety disorder, unspecified: Secondary | ICD-10-CM | POA: Insufficient documentation

## 2021-03-14 DIAGNOSIS — E876 Hypokalemia: Secondary | ICD-10-CM | POA: Insufficient documentation

## 2021-03-14 DIAGNOSIS — I471 Supraventricular tachycardia: Secondary | ICD-10-CM | POA: Insufficient documentation

## 2021-03-14 DIAGNOSIS — Z881 Allergy status to other antibiotic agents status: Secondary | ICD-10-CM | POA: Insufficient documentation

## 2021-03-14 DIAGNOSIS — Z23 Encounter for immunization: Secondary | ICD-10-CM | POA: Insufficient documentation

## 2021-03-14 DIAGNOSIS — R7989 Other specified abnormal findings of blood chemistry: Secondary | ICD-10-CM | POA: Insufficient documentation

## 2021-03-14 DIAGNOSIS — Z791 Long term (current) use of non-steroidal anti-inflammatories (NSAID): Secondary | ICD-10-CM | POA: Insufficient documentation

## 2021-03-14 DIAGNOSIS — F3341 Major depressive disorder, recurrent, in partial remission: Secondary | ICD-10-CM | POA: Insufficient documentation

## 2021-03-14 DIAGNOSIS — Z419 Encounter for procedure for purposes other than remedying health state, unspecified: Secondary | ICD-10-CM

## 2021-03-14 DIAGNOSIS — R739 Hyperglycemia, unspecified: Secondary | ICD-10-CM | POA: Insufficient documentation

## 2021-03-14 DIAGNOSIS — K8020 Calculus of gallbladder without cholecystitis without obstruction: Secondary | ICD-10-CM | POA: Diagnosis present

## 2021-03-14 DIAGNOSIS — Z792 Long term (current) use of antibiotics: Secondary | ICD-10-CM | POA: Insufficient documentation

## 2021-03-14 HISTORY — PX: CHOLECYSTECTOMY: SHX55

## 2021-03-14 LAB — HEPATIC FUNCTION PANEL
ALT: 105 U/L — ABNORMAL HIGH (ref 0–44)
AST: 270 U/L — ABNORMAL HIGH (ref 15–41)
Albumin: 4.1 g/dL (ref 3.5–5.0)
Alkaline Phosphatase: 74 U/L (ref 38–126)
Bilirubin, Direct: 1 mg/dL — ABNORMAL HIGH (ref 0.0–0.2)
Indirect Bilirubin: 1.1 mg/dL — ABNORMAL HIGH (ref 0.3–0.9)
Total Bilirubin: 2.1 mg/dL — ABNORMAL HIGH (ref 0.3–1.2)
Total Protein: 7 g/dL (ref 6.5–8.1)

## 2021-03-14 LAB — BASIC METABOLIC PANEL
Anion gap: 12 (ref 5–15)
BUN: 22 mg/dL — ABNORMAL HIGH (ref 6–20)
CO2: 23 mmol/L (ref 22–32)
Calcium: 9.1 mg/dL (ref 8.9–10.3)
Chloride: 105 mmol/L (ref 98–111)
Creatinine, Ser: 1.18 mg/dL — ABNORMAL HIGH (ref 0.44–1.00)
GFR, Estimated: 53 mL/min — ABNORMAL LOW (ref >60)
Glucose, Bld: 157 mg/dL — ABNORMAL HIGH (ref 70–99)
Potassium: 3.2 mmol/L — ABNORMAL LOW (ref 3.5–5.1)
Sodium: 140 mmol/L (ref 135–145)

## 2021-03-14 LAB — CBC
HCT: 38 % (ref 36.0–46.0)
Hemoglobin: 13.3 g/dL (ref 12.0–15.0)
MCH: 32 pg (ref 26.0–34.0)
MCHC: 35 g/dL (ref 30.0–36.0)
MCV: 91.6 fL (ref 80.0–100.0)
Platelets: 301 10*3/uL (ref 150–400)
RBC: 4.15 MIL/uL (ref 3.87–5.11)
RDW: 12.2 % (ref 11.5–15.5)
WBC: 13.7 10*3/uL — ABNORMAL HIGH (ref 4.0–10.5)
nRBC: 0 % (ref 0.0–0.2)

## 2021-03-14 LAB — I-STAT BETA HCG BLOOD, ED (MC, WL, AP ONLY): I-stat hCG, quantitative: 6.3 m[IU]/mL — ABNORMAL HIGH (ref <5)

## 2021-03-14 LAB — TROPONIN I (HIGH SENSITIVITY): Troponin I (High Sensitivity): <2 ng/L (ref <18)

## 2021-03-14 LAB — RESP PANEL BY RT-PCR (FLU A&B, COVID) ARPGX2
Influenza A by PCR: NEGATIVE
Influenza B by PCR: NEGATIVE
SARS Coronavirus 2 by RT PCR: NEGATIVE

## 2021-03-14 LAB — MAGNESIUM: Magnesium: 2.2 mg/dL (ref 1.7–2.4)

## 2021-03-14 LAB — LIPASE, BLOOD: Lipase: 34 U/L (ref 11–51)

## 2021-03-14 LAB — HCG, SERUM, QUALITATIVE: Preg, Serum: NEGATIVE

## 2021-03-14 SURGERY — LAPAROSCOPIC CHOLECYSTECTOMY WITH INTRAOPERATIVE CHOLANGIOGRAM
Anesthesia: General

## 2021-03-14 MED ORDER — HEPARIN SODIUM (PORCINE) 5000 UNIT/ML IJ SOLN
5000.0000 [IU] | Freq: Three times a day (TID) | INTRAMUSCULAR | Status: DC
  Administered 2021-03-15: 5000 [IU] via SUBCUTANEOUS
  Filled 2021-03-14: qty 1

## 2021-03-14 MED ORDER — PROPOFOL 10 MG/ML IV BOLUS
INTRAVENOUS | Status: AC
  Filled 2021-03-14: qty 20

## 2021-03-14 MED ORDER — SODIUM CHLORIDE 0.9 % IV SOLN: INTRAVENOUS | Status: DC

## 2021-03-14 MED ORDER — PROMETHAZINE HCL 25 MG/ML IJ SOLN: 6.2500 mg | INTRAMUSCULAR | Status: DC | PRN

## 2021-03-14 MED ORDER — DEXAMETHASONE SODIUM PHOSPHATE 10 MG/ML IJ SOLN
INTRAMUSCULAR | Status: AC
  Filled 2021-03-14: qty 1

## 2021-03-14 MED ORDER — PHENYLEPHRINE HCL (PRESSORS) 10 MG/ML IV SOLN
INTRAVENOUS | Status: AC
  Filled 2021-03-14: qty 1

## 2021-03-14 MED ORDER — BUPIVACAINE-EPINEPHRINE 0.25% -1:200000 IJ SOLN
INTRAMUSCULAR | Status: DC | PRN
  Administered 2021-03-14: 20 mL

## 2021-03-14 MED ORDER — POTASSIUM CHLORIDE 10 MEQ/100ML IV SOLN
10.0000 meq | INTRAVENOUS | Status: DC
  Administered 2021-03-14: 10 meq via INTRAVENOUS
  Filled 2021-03-14 (×2): qty 100

## 2021-03-14 MED ORDER — FENTANYL CITRATE (PF) 100 MCG/2ML IJ SOLN
25.0000 ug | Freq: Once | INTRAMUSCULAR | Status: AC
  Administered 2021-03-14: 25 ug via INTRAVENOUS
  Filled 2021-03-14: qty 2

## 2021-03-14 MED ORDER — ROCURONIUM BROMIDE 10 MG/ML (PF) SYRINGE
PREFILLED_SYRINGE | INTRAVENOUS | Status: DC | PRN
  Administered 2021-03-14: 50 mg via INTRAVENOUS

## 2021-03-14 MED ORDER — LIDOCAINE 2% (20 MG/ML) 5 ML SYRINGE
INTRAMUSCULAR | Status: AC
  Filled 2021-03-14: qty 5

## 2021-03-14 MED ORDER — ACETAMINOPHEN 10 MG/ML IV SOLN
1000.0000 mg | Freq: Once | INTRAVENOUS | Status: DC | PRN
Start: 2021-03-14 — End: 2021-03-14

## 2021-03-14 MED ORDER — PHENYLEPHRINE 40 MCG/ML (10ML) SYRINGE FOR IV PUSH (FOR BLOOD PRESSURE SUPPORT)
PREFILLED_SYRINGE | INTRAVENOUS | Status: DC | PRN
  Administered 2021-03-14: 40 ug via INTRAVENOUS
  Administered 2021-03-14: 120 ug via INTRAVENOUS

## 2021-03-14 MED ORDER — LACTATED RINGERS IV SOLN: INTRAVENOUS | Status: DC | PRN

## 2021-03-14 MED ORDER — 0.9 % SODIUM CHLORIDE (POUR BTL) OPTIME
TOPICAL | Status: DC | PRN
  Administered 2021-03-14: 1000 mL

## 2021-03-14 MED ORDER — ACETAMINOPHEN 160 MG/5ML PO SOLN: 325.0000 mg | ORAL | Status: DC | PRN

## 2021-03-14 MED ORDER — IOHEXOL 300 MG/ML  SOLN
INTRAMUSCULAR | Status: DC | PRN
  Administered 2021-03-14: 30 mL

## 2021-03-14 MED ORDER — LORAZEPAM 1 MG PO TABS
1.0000 mg | ORAL_TABLET | Freq: Once | ORAL | Status: AC
  Administered 2021-03-14: 1 mg via ORAL
  Filled 2021-03-14: qty 1

## 2021-03-14 MED ORDER — SUGAMMADEX SODIUM 200 MG/2ML IV SOLN
INTRAVENOUS | Status: DC | PRN
  Administered 2021-03-14: 140 mg via INTRAVENOUS

## 2021-03-14 MED ORDER — FENTANYL CITRATE (PF) 100 MCG/2ML IJ SOLN: 25.0000 ug | INTRAMUSCULAR | Status: DC | PRN

## 2021-03-14 MED ORDER — SODIUM CHLORIDE 0.9 % IV SOLN
2.0000 g | Freq: Once | INTRAVENOUS | Status: AC
  Administered 2021-03-14: 2 g via INTRAVENOUS
  Filled 2021-03-14: qty 20

## 2021-03-14 MED ORDER — MIDAZOLAM HCL 2 MG/2ML IJ SOLN
INTRAMUSCULAR | Status: DC | PRN
  Administered 2021-03-14: 2 mg via INTRAVENOUS

## 2021-03-14 MED ORDER — PNEUMOCOCCAL VAC POLYVALENT 25 MCG/0.5ML IJ INJ
0.5000 mL | INJECTION | INTRAMUSCULAR | Status: AC
  Administered 2021-03-15: 0.5 mL via INTRAMUSCULAR
  Filled 2021-03-14: qty 0.5

## 2021-03-14 MED ORDER — SERTRALINE HCL 100 MG PO TABS
100.0000 mg | ORAL_TABLET | Freq: Every day | ORAL | Status: DC
  Administered 2021-03-15: 100 mg via ORAL
  Filled 2021-03-14: qty 1

## 2021-03-14 MED ORDER — OXYCODONE HCL 5 MG PO TABS
5.0000 mg | ORAL_TABLET | Freq: Once | ORAL | Status: DC | PRN
Start: 2021-03-14 — End: 2021-03-14

## 2021-03-14 MED ORDER — HYDROCODONE-ACETAMINOPHEN 5-325 MG PO TABS
1.0000 | ORAL_TABLET | ORAL | Status: DC | PRN
  Administered 2021-03-14 – 2021-03-15 (×2): 1 via ORAL
  Filled 2021-03-14 (×2): qty 1

## 2021-03-14 MED ORDER — LIDOCAINE HCL (CARDIAC) PF 100 MG/5ML IV SOSY
PREFILLED_SYRINGE | INTRAVENOUS | Status: DC | PRN
  Administered 2021-03-14: 40 mg via INTRAVENOUS

## 2021-03-14 MED ORDER — LOSARTAN POTASSIUM 25 MG PO TABS
25.0000 mg | ORAL_TABLET | Freq: Every day | ORAL | Status: DC
  Administered 2021-03-15: 25 mg via ORAL
  Filled 2021-03-14: qty 1

## 2021-03-14 MED ORDER — MORPHINE SULFATE (PF) 2 MG/ML IV SOLN: 1.0000 mg | INTRAVENOUS | Status: DC | PRN

## 2021-03-14 MED ORDER — PANTOPRAZOLE SODIUM 40 MG IV SOLR
40.0000 mg | Freq: Every day | INTRAVENOUS | Status: DC
  Administered 2021-03-14: 40 mg via INTRAVENOUS
  Filled 2021-03-14: qty 40

## 2021-03-14 MED ORDER — LIDOCAINE HCL 2 % IJ SOLN
INTRAMUSCULAR | Status: AC
  Filled 2021-03-14: qty 20

## 2021-03-14 MED ORDER — SODIUM CHLORIDE 0.9 % IR SOLN
Status: DC | PRN
  Administered 2021-03-14: 1000 mL

## 2021-03-14 MED ORDER — DEXMEDETOMIDINE (PRECEDEX) IN NS 20 MCG/5ML (4 MCG/ML) IV SYRINGE
PREFILLED_SYRINGE | INTRAVENOUS | Status: AC
  Filled 2021-03-14: qty 5

## 2021-03-14 MED ORDER — BUPIVACAINE-EPINEPHRINE (PF) 0.25% -1:200000 IJ SOLN
INTRAMUSCULAR | Status: AC
  Filled 2021-03-14: qty 30

## 2021-03-14 MED ORDER — ROCURONIUM BROMIDE 10 MG/ML (PF) SYRINGE
PREFILLED_SYRINGE | INTRAVENOUS | Status: AC
  Filled 2021-03-14: qty 10

## 2021-03-14 MED ORDER — PHENYLEPHRINE HCL-NACL 10-0.9 MG/250ML-% IV SOLN
INTRAVENOUS | Status: DC | PRN
  Administered 2021-03-14: 25 ug/min via INTRAVENOUS

## 2021-03-14 MED ORDER — FENTANYL CITRATE (PF) 250 MCG/5ML IJ SOLN
INTRAMUSCULAR | Status: AC
  Filled 2021-03-14: qty 5

## 2021-03-14 MED ORDER — DEXAMETHASONE SODIUM PHOSPHATE 10 MG/ML IJ SOLN
INTRAMUSCULAR | Status: DC | PRN
  Administered 2021-03-14: 5 mg via INTRAVENOUS

## 2021-03-14 MED ORDER — MIDAZOLAM HCL 2 MG/2ML IJ SOLN
INTRAMUSCULAR | Status: AC
  Filled 2021-03-14: qty 2

## 2021-03-14 MED ORDER — ACETAMINOPHEN 325 MG PO TABS: 325.0000 mg | ORAL_TABLET | ORAL | Status: DC | PRN

## 2021-03-14 MED ORDER — ATENOLOL 50 MG PO TABS
100.0000 mg | ORAL_TABLET | Freq: Two times a day (BID) | ORAL | Status: DC
  Administered 2021-03-14 – 2021-03-15 (×2): 100 mg via ORAL
  Filled 2021-03-14 (×2): qty 2

## 2021-03-14 MED ORDER — ONDANSETRON HCL 4 MG/2ML IJ SOLN: 4.0000 mg | Freq: Four times a day (QID) | INTRAMUSCULAR | Status: DC | PRN

## 2021-03-14 MED ORDER — POTASSIUM CHLORIDE IN NACL 40-0.9 MEQ/L-% IV SOLN
INTRAVENOUS | Status: DC
  Filled 2021-03-14: qty 1000

## 2021-03-14 MED ORDER — AMISULPRIDE (ANTIEMETIC) 5 MG/2ML IV SOLN: 10.0000 mg | Freq: Once | INTRAVENOUS | Status: DC | PRN

## 2021-03-14 MED ORDER — ONDANSETRON HCL 4 MG/2ML IJ SOLN
INTRAMUSCULAR | Status: AC
  Filled 2021-03-14: qty 2

## 2021-03-14 MED ORDER — OXYCODONE HCL 5 MG/5ML PO SOLN: 5.0000 mg | Freq: Once | ORAL | Status: DC | PRN

## 2021-03-14 MED ORDER — ONDANSETRON 4 MG PO TBDP
4.0000 mg | ORAL_TABLET | Freq: Four times a day (QID) | ORAL | Status: DC | PRN
  Administered 2021-03-14: 4 mg via ORAL
  Filled 2021-03-14: qty 1

## 2021-03-14 MED ORDER — ONDANSETRON HCL 4 MG/2ML IJ SOLN
INTRAMUSCULAR | Status: DC | PRN
  Administered 2021-03-14: 4 mg via INTRAVENOUS

## 2021-03-14 MED ORDER — PROPOFOL 10 MG/ML IV BOLUS
INTRAVENOUS | Status: DC | PRN
  Administered 2021-03-14: 110 mg via INTRAVENOUS

## 2021-03-14 MED ORDER — FENTANYL CITRATE (PF) 250 MCG/5ML IJ SOLN
INTRAMUSCULAR | Status: DC | PRN
  Administered 2021-03-14 (×2): 25 ug via INTRAVENOUS
  Administered 2021-03-14: 50 ug via INTRAVENOUS

## 2021-03-14 MED ORDER — BUPROPION HCL ER (SR) 150 MG PO TB12
150.0000 mg | ORAL_TABLET | Freq: Two times a day (BID) | ORAL | Status: DC
  Administered 2021-03-14 – 2021-03-15 (×2): 150 mg via ORAL
  Filled 2021-03-14 (×2): qty 1

## 2021-03-14 SURGICAL SUPPLY — 30 items
APPLIER CLIP 5 13 M/L LIGAMAX5 (MISCELLANEOUS) ×2
BAG COUNTER SPONGE SURGICOUNT (BAG) IMPLANT
CABLE HIGH FREQUENCY MONO STRZ (ELECTRODE) ×2 IMPLANT
CATH REDDICK CHOLANGI 4FR 50CM (CATHETERS) ×2 IMPLANT
CHLORAPREP W/TINT 26 (MISCELLANEOUS) ×2 IMPLANT
CLIP APPLIE 5 13 M/L LIGAMAX5 (MISCELLANEOUS) ×1 IMPLANT
COVER MAYO STAND STRL (DRAPES) ×2 IMPLANT
DECANTER SPIKE VIAL GLASS SM (MISCELLANEOUS) ×2 IMPLANT
DERMABOND ADVANCED (GAUZE/BANDAGES/DRESSINGS) ×1
DERMABOND ADVANCED .7 DNX12 (GAUZE/BANDAGES/DRESSINGS) ×1 IMPLANT
DRAPE C-ARM 42X120 X-RAY (DRAPES) ×2 IMPLANT
ELECT REM PT RETURN 15FT ADLT (MISCELLANEOUS) ×2 IMPLANT
GLOVE SURG ENC MOIS LTX SZ7.5 (GLOVE) ×2 IMPLANT
GOWN STRL REUS W/TWL XL LVL3 (GOWN DISPOSABLE) ×6 IMPLANT
HEMOSTAT SURGICEL 4X8 (HEMOSTASIS) IMPLANT
IV CATH 14GX2 1/4 (CATHETERS) ×4 IMPLANT
KIT BASIN OR (CUSTOM PROCEDURE TRAY) ×2 IMPLANT
KIT TURNOVER KIT A (KITS) ×2 IMPLANT
PENCIL SMOKE EVACUATOR (MISCELLANEOUS) IMPLANT
POUCH SPECIMEN RETRIEVAL 10MM (ENDOMECHANICALS) ×2 IMPLANT
SCISSORS LAP 5X35 DISP (ENDOMECHANICALS) ×2 IMPLANT
SET IRRIG TUBING LAPAROSCOPIC (IRRIGATION / IRRIGATOR) ×2 IMPLANT
SET TUBE SMOKE EVAC HIGH FLOW (TUBING) ×2 IMPLANT
SLEEVE XCEL OPT CAN 5 100 (ENDOMECHANICALS) ×4 IMPLANT
SUT MNCRL AB 4-0 PS2 18 (SUTURE) ×2 IMPLANT
TOWEL OR 17X26 10 PK STRL BLUE (TOWEL DISPOSABLE) ×2 IMPLANT
TOWEL OR NON WOVEN STRL DISP B (DISPOSABLE) ×2 IMPLANT
TRAY LAPAROSCOPIC (CUSTOM PROCEDURE TRAY) ×2 IMPLANT
TROCAR BLADELESS OPT 5 100 (ENDOMECHANICALS) ×2 IMPLANT
TROCAR XCEL BLUNT TIP 100MML (ENDOMECHANICALS) ×2 IMPLANT

## 2021-03-14 NOTE — Anesthesia Postprocedure Evaluation (Signed)
Anesthesia Post Note  Patient: Barbara Haynes  Procedure(s) Performed: LAPAROSCOPIC CHOLECYSTECTOMY WITH INTRAOPERATIVE CHOLANGIOGRAM     Patient location during evaluation: PACU Anesthesia Type: General Level of consciousness: awake and alert Pain management: pain level controlled Vital Signs Assessment: post-procedure vital signs reviewed and stable Respiratory status: spontaneous breathing, nonlabored ventilation, respiratory function stable and patient connected to nasal cannula oxygen Cardiovascular status: blood pressure returned to baseline and stable Postop Assessment: no apparent nausea or vomiting Anesthetic complications: no   No notable events documented.  Last Vitals:  Vitals:   03/14/21 1909 03/14/21 2034  BP: (!) 107/56 103/64  Pulse: 63 68  Resp: 18 18  Temp: 36.8 C 36.9 C  SpO2: 99% 99%    Last Pain:  Vitals:   03/14/21 2034  TempSrc: Oral  PainSc:                  Kyan Giannone

## 2021-03-14 NOTE — ED Provider Notes (Addendum)
[facility]-EMERGENCY DEPT Provider Note   CSN: [id number] Arrival date & time: 03/14/21  0505     History Chief Complaint  Patient presents with   Chest Pain    Barbara Haynes is a 60 y.o. female.  Patient with history of SVT, fibromyalgia and major depression to ED with complaint of chest pain. The patient has asked her boyfriend present at bedside to provide history because she doesn't feel she can answer any more questions. Per Phylliss Blakes, she woke this morning with pain in the right chest, breathing heavily and numbness of both arms and legs. The patient does localize the pain to right lower chest "underneath my ribs". She became tearful and symptoms have persisted since onset. No recent cough or fever. No vomiting.    Chest Pain Associated symptoms: numbness   Associated symptoms: no fever       Past Medical History:  Diagnosis Date   Fibromyalgia    Hypoglycemia    Migraines    SVT (supraventricular tachycardia) [facility])     Patient Active Problem List   Diagnosis Date Noted   Erroneous encounter - disregard 02/18/2021   Recurrent major depressive disorder, in partial remission (HCC) 11/25/2020   Moderate episode of recurrent major depressive disorder (HCC) 05/28/2020   Chest pain 10/03/2018    Past Surgical History:  Procedure Laterality Date   TONSILLECTOMY       OB History     Gravida  3   Para      Term      Preterm      AB      Living         SAB      IAB      Ectopic      Multiple      Live Births  2           Family History  Problem Relation Age of Onset   Supraventricular tachycardia Mother    Supraventricular tachycardia Other     Social History   Tobacco Use   Smoking status: Never   Smokeless tobacco: Never  Vaping Use   Vaping Use: Never used  Substance Use Topics   Alcohol use: No   Drug use: No    Home Medications Prior to Admission medications   Medication Sig Start Date End Date  Taking? Authorizing Provider  acetaminophen (TYLENOL) 500 MG tablet Take 1,000 mg by mouth every 6 (six) hours as needed for mild pain or headache.     [provider]  amoxicillin-clavulanate (AUGMENTIN) 875-125 MG tablet Take 1 tablet by mouth every 12 (twelve) hours. 02/24/21   Walisiewicz, Kaitlyn E, PA-C  atenolol (TENORMIN) 50 MG tablet Take 100 mg by mouth 2 (two) times daily.    [provider]  buPROPion (WELLBUTRIN SR) 150 MG 12 hr tablet Take 1 tablet (150 mg total) by mouth 2 (two) times daily. 11/25/20   Zena Amos, MD  losartan (COZAAR) 25 MG tablet Take 25 mg by mouth daily.    [provider]  meloxicam (MOBIC) 15 MG tablet Take 15 mg by mouth daily.  09/23/18   [provider]  Nutritional Supplements (VITAMIN D BOOSTER PO) Take 2,000 tablets by mouth 2 (two) times daily.     [provider]  omeprazole (PRILOSEC) 10 MG capsule Take 10 mg by mouth daily.    [provider]  PROVENTIL HFA 108 (90 Base) MCG/ACT inhaler Inhale 2 puffs into the lungs 4 (  four) times daily as needed for shortness of breath. 09/23/18   [provider]  sertraline (ZOLOFT) 100 MG tablet Take 1 tablet (100 mg total) by mouth in the morning. 11/25/20   Zena Amos, MD  tiZANidine (ZANAFLEX) 4 MG tablet Take 2 mg by mouth at bedtime.     [provider]    Allergies    Erythromycin, Lisinopril, Adhesive [tape], Benadryl [diphenhydramine], and Sulfa antibiotics  Review of Systems   Review of Systems  Constitutional:  Negative for chills and fever.  HENT: Negative.    Respiratory: Negative.    Cardiovascular:  Positive for chest pain.  Gastrointestinal: Negative.   Musculoskeletal: Negative.   Skin: Negative.   Neurological:  Positive for numbness.   Physical Exam Updated Vital Signs BP (!) 109/94 (BP Location: Left Arm)   Pulse 90   Temp (!) 97.4 F (36.3 C) (Oral)   Resp 16   Ht 5\' 2"  (1.575 m)   Wt 61.2 kg   SpO2 100%    BMI 24.69 kg/m   Physical Exam Vitals and nursing note reviewed.  Constitutional:      General: She is not in acute distress.    Appearance: She is well-developed.     Comments: Tearful, hyperventilating,   HENT:     Head: Normocephalic.  Cardiovascular:     Rate and Rhythm: Normal rate and regular rhythm.     Heart sounds: No murmur heard. Pulmonary:     Effort: Pulmonary effort is normal.     Breath sounds: Normal breath sounds. No wheezing, rhonchi or rales.  Chest:     Chest wall: No mass.  Abdominal:     General: Bowel sounds are normal.     Palpations: Abdomen is soft.     Tenderness: There is no abdominal tenderness. There is no guarding or rebound.     Comments: Specifically no RUQ abdominal pain.  Musculoskeletal:        General: Normal range of motion.     Cervical back: Normal range of motion and neck supple.     Right lower leg: No edema.     Left lower leg: No edema.  Skin:    General: Skin is warm and dry.  Neurological:     General: No focal deficit present.     Mental Status: She is alert and oriented to person, place, and time.    ED Results / Procedures / Treatments   Labs (all labs ordered are listed, but only abnormal results are displayed) Labs Reviewed  BASIC METABOLIC PANEL - Abnormal; Notable for the following components:      Result Value   Potassium 3.2 (*)    Glucose, Bld 157 (*)    BUN 22 (*)    Creatinine, Ser 1.18 (*)    GFR, Estimated 53 (*)    All other components within normal limits  CBC - Abnormal; Notable for the following components:   WBC 13.7 (*)    All other components within normal limits  I-STAT BETA HCG BLOOD, ED ([facility], [facility], [facility] ONLY) - Abnormal; Notable for the following components:   I-stat hCG, quantitative 6.3 (*)    All other components within normal limits  TROPONIN I (HIGH SENSITIVITY)    EKG EKG Interpretation  Date/Time:  Friday March 14 2021 05:13:31 EDT Ventricular Rate:  79 PR Interval:  152 QRS  Duration: 95 QT Interval:  398 QTC Calculation: 457 R Axis:   49 Text Interpretation: Sinus rhythm Ventricular premature  complex Confirmed by Nicanor Alcon, April ([id number]) on 03/14/2021 5:20:06 AM  Radiology DG Chest 2 View  Result Date: 03/14/2021 CLINICAL DATA:  Chest pain EXAM: CHEST - 2 VIEW COMPARISON:  10/03/2018 FINDINGS: Normal heart size and mediastinal contours. No acute infiltrate or edema. No effusion or pneumothorax. No acute osseous findings. IMPRESSION: Negative chest. Electronically Signed   By: Marnee Spring M.D.   On: 03/14/2021 05:35    Procedures Procedures   Medications Ordered in ED Medications  LORazepam (ATIVAN) tablet 1 mg (has no administration in time range)    ED Course  I have reviewed the triage vital signs and the nursing notes.  Pertinent labs & imaging results that were available during my care of the patient were reviewed by me and considered in my medical decision making (see chart for details).    MDM Rules/Calculators/A&P                           Patient to ED with ss/sxs as per HPI.   She is tearful during exam, speech delayed/broken due to having to take frequent deep breaths. No hypoxia.   CXR normal. EKG nonischemic. Labs including troponin x1 negative. Chart reviewed. Coronary CT done 09/2018 normal. History of anxiety which is felt likely to be the source of the patient's symptoms. No infection, not c/w ACS, PTX, dissection.   7:00 - on re-exam, the patient is much more calm, able to provide a detailed history, confirming pain under right lower ribs, no nausea, no vomiting, no fever. No history of similar pain. She states changing positions in trying to get comfortable made the pain increase. No alleviating factors. Re-exam of the abdomen shows mild increase to right lower chest pain with deep palpation of the RUQ abdomen. No epigastric tenderness.   Given additional information, feel it is reasonable to evaluate further with exam aimed at  biliary source of pain. [place], LFT's, lipase ordered. Pain addressed with small amount of Fentanyl.   Patient's HCG is slightly elevated in postmenopausal woman. Recommend follow up with OB/GYN.   Patient care signed out to Fayrene Helper, PA-C, pending additional labs and US of the RUQ abdomen. If negative, it has been discussed with the patient that she can be discharged home with likely MSK pain, will Rx flexeril, PCP follow up.   Final Clinical Impression(s) / ED Diagnoses Final diagnoses:  None   Nonspecific chest pain  Rx / DC Orders ED Discharge Orders     None        Danne Harbor 03/14/21 0650    Palumbo, April, MD 03/14/21 0658    Elpidio Anis, PA-C 03/14/21 8413    Palumbo, April, MD 03/14/21 2318

## 2021-03-14 NOTE — ED Notes (Signed)
Patient transported to X-ray 

## 2021-03-14 NOTE — ED Notes (Signed)
Pt refused cardiac monitoring stating that she is allergic to the adhesive.

## 2021-03-14 NOTE — ED Triage Notes (Signed)
Pt reports sharp sternal chest pain that appeared sudden while asleep beginning around 0200.

## 2021-03-14 NOTE — Transfer of Care (Signed)
Immediate Anesthesia Transfer of Care Note  Patient: Barbara Haynes  Procedure(s) Performed: LAPAROSCOPIC CHOLECYSTECTOMY WITH INTRAOPERATIVE CHOLANGIOGRAM  Patient Location: PACU  Anesthesia Type:General  Level of Consciousness: awake, drowsy and patient cooperative  Airway & Oxygen Therapy: Patient Spontanous Breathing and Patient connected to face mask oxygen  Post-op Assessment: Report given to RN and Post -op Vital signs reviewed and stable  Post vital signs: Reviewed and stable  Last Vitals:  Vitals Value Taken Time  BP 119/75 03/14/21 1630  Temp    Pulse 79 03/14/21 1632  Resp 16 03/14/21 1632  SpO2 100 % 03/14/21 1632  Vitals shown include unvalidated device data.  Last Pain:  Vitals:   03/14/21 0906  TempSrc:   PainSc: 2       Patients Stated Pain Goal: 0 (03/14/21 0515)  Complications: No notable events documented.

## 2021-03-14 NOTE — ED Provider Notes (Signed)
Received signout from previous provider, please see her note for complete H&P.  In short this is a 60 year old female who awoke this morning with pain to her right side of the upper abdomen and chest.  She also felt very tremulous initially however after receiving pain medication she reported feeling much better.  Her labs remarkable for mild AKI with a creatinine of 1.18 and a BUN of 22.  Mild hypokalemia with potassium of 3.2.  She has an elevated white count of 13.7 as well as evidence of transaminitis with AST 270, ALT 105, total bili of 2.1.  A limited abdominal ultrasound was obtained demonstrate cholelithiasis with gallbladder tenderness but no visible wall inflammation however early gallbladder obstruction is possible.  Although patient report improvement of her symptoms she does have reproducible right upper quadrant abdominal pain on reexamination.  In the setting of leukocytosis, transaminitis, localized right upper quadrant tenderness I am concerned for acute cholecystitis and will consult general surgery for further management.  10:11 AM Appreciate consultation from general surgery who recommend in hospitalist to admit, start patient on Rocephin and go from there.  They will be involved in the patient care as well.  10:22 AM [facility] who agrees to see and will admit pt.    BP (!) 100/59   Pulse 71   Temp (!) 97.4 F (36.3 C) (Oral)   Resp 18   Ht 5\' 2"  (1.575 m)   Wt 61.2 kg   SpO2 98%   BMI 24.69 kg/m   Results for orders placed or performed during the hospital encounter of 03/14/21  Basic metabolic panel  Result Value Ref Range   Sodium 140 135 - 145 mmol/L   Potassium 3.2 (L) 3.5 - 5.1 mmol/L   Chloride 105 98 - 111 mmol/L   CO2 23 22 - 32 mmol/L   Glucose, Bld 157 (H) 70 - 99 mg/dL   BUN 22 (H) 6 - 20 mg/dL   Creatinine, Ser 03/16/21 (H) 0.44 - 1.00 mg/dL   Calcium 9.1 8.9 - 0.10 mg/dL   GFR, Estimated 53 (L) >60 mL/min    Anion gap 12 5 - 15  CBC  Result Value Ref Range   WBC 13.7 (H) 4.0 - 10.5 K/uL   RBC 4.15 3.87 - 5.11 MIL/uL   Hemoglobin 13.3 12.0 - 15.0 g/dL   HCT 27.2 53.6 - 64.4 %   MCV 91.6 80.0 - 100.0 fL   MCH 32.0 26.0 - 34.0 pg   MCHC 35.0 30.0 - 36.0 g/dL   RDW 03.4 74.2 - 59.5 %   Platelets 301 150 - 400 K/uL   nRBC 0.0 0.0 - 0.2 %  Hepatic function panel  Result Value Ref Range   Total Protein 7.0 6.5 - 8.1 g/dL   Albumin 4.1 3.5 - 5.0 g/dL   AST 63.8 (H) 15 - 41 U/L   ALT 105 (H) 0 - 44 U/L   Alkaline Phosphatase 74 38 - 126 U/L   Total Bilirubin 2.1 (H) 0.3 - 1.2 mg/dL   Bilirubin, Direct 1.0 (H) 0.0 - 0.2 mg/dL   Indirect Bilirubin 1.1 (H) 0.3 - 0.9 mg/dL  Lipase, blood  Result Value Ref Range   Lipase 34 11 - 51 U/L  I-Stat beta hCG blood, ED  Result Value Ref Range   I-stat hCG, quantitative 6.3 (H) <5 mIU/mL   Comment 3          Troponin I (High Sensitivity)  Result  Value Ref Range   Troponin I (High Sensitivity) <2 <18 ng/L   DG Chest 2 View  Result Date: 03/14/2021 CLINICAL DATA:  Chest pain EXAM: CHEST - 2 VIEW COMPARISON:  10/03/2018 FINDINGS: Normal heart size and mediastinal contours. No acute infiltrate or edema. No effusion or pneumothorax. No acute osseous findings. IMPRESSION: Negative chest. Electronically Signed   By: Marnee Spring M.D.   On: 03/14/2021 05:35   US Abdomen Limited  Result Date: 03/14/2021 CLINICAL DATA:  Right upper quadrant pain EXAM: ULTRASOUND ABDOMEN LIMITED RIGHT UPPER QUADRANT COMPARISON:  None. FINDINGS: Gallbladder: Small layering calculi measured at up to 1 cm. Pericholecystic borderline wall thickening without edema fluid. There is focal tenderness per sonographer exam Common bile duct: Diameter: 3 mm Liver: No focal lesion identified. Within normal limits in parenchymal echogenicity. Portal vein is patent on color Doppler imaging with normal direction of blood flow towards the liver. IMPRESSION: Cholelithiasis. There is gallbladder  tenderness but no visible wall inflammation typical of acute cholecystitis. Early gallbladder obstruction is possible. Electronically Signed   By: Marnee Spring M.D.   On: 03/14/2021 08:42      Fayrene Helper, PA-C 03/14/21 1023    Terrilee Files, MD 03/14/21 [place]

## 2021-03-14 NOTE — Discharge Instructions (Signed)
[facility] ______[facility] LAPAROSCOPIC SURGERY: POST OP INSTRUCTIONS Always review your discharge instruction sheet given to you by the facility where your surgery was performed. IF YOU HAVE DISABILITY OR FAMILY LEAVE FORMS, YOU MUST BRING THEM TO THE OFFICE FOR PROCESSING.   DO NOT GIVE THEM TO YOUR DOCTOR.  A prescription for pain medication may be given to you upon discharge.  Take your pain medication as prescribed, if needed.  If narcotic pain medicine is not needed, then you may take acetaminophen (Tylenol) or ibuprofen (Advil) as needed. Take your usually prescribed medications unless otherwise directed. If you need a refill on your pain medication, please contact your pharmacy.  They will contact our office to request authorization. Prescriptions will not be filled after 5pm or on week-ends. You should follow a light diet the first few days after arrival home, such as soup and crackers, etc.  Be sure to include lots of fluids daily. Most patients will experience some swelling and bruising in the area of the incisions.  Ice packs will help.  Swelling and bruising can take several days to resolve.  It is common to experience some constipation if taking pain medication after surgery.  Increasing fluid intake and taking a stool softener (such as Colace) will usually help or prevent this problem from occurring.  A mild laxative (Milk of Magnesia or Miralax) should be taken according to package instructions if there are no bowel movements after 48 hours. Unless discharge instructions indicate otherwise, you may remove your bandages 24-48 hours after surgery, and you may shower at that time.  You may have steri-strips (small skin tapes) in place directly over the incision.  These strips should be left on the skin for 7-10 days.  If your surgeon used skin glue on the incision, you may shower in 24 hours.  The glue will flake off over the next 2-3 weeks.  Any sutures or staples will be  removed at the office during your follow-up visit. ACTIVITIES:  You may resume regular (light) daily activities beginning the next day--such as daily self-care, walking, climbing stairs--gradually increasing activities as tolerated.  You may have sexual intercourse when it is comfortable.  Refrain from any heavy lifting or straining until approved by your doctor. You may drive when you are no longer taking prescription pain medication, you can comfortably wear a seatbelt, and you can safely maneuver your car and apply brakes. RETURN TO WORK:  __________________________________________________________ You should see your doctor in the office for a follow-up appointment approximately 2-3 weeks after your surgery.  Make sure that you call for this appointment within a day or two after you arrive home to insure a convenient appointment time. OTHER INSTRUCTIONS: __________________________________________________________________________________________________________________________ __________________________________________________________________________________________________________________________ WHEN TO CALL YOUR DOCTOR: Fever over 101.0 Inability to urinate Continued bleeding from incision. Increased pain, redness, or drainage from the incision. Increasing abdominal pain  The clinic staff is available to answer your questions during regular business hours.  Please don't hesitate to call and ask to speak to one of the nurses for clinical concerns.  If you have a medical emergency, go to the nearest emergency room or call 911.  A surgeon from [facility] is always on call at the hospital. [address], [address], [place], [place]  [address] ? [address] [address], [place], [place]   [address] [phone] ? [phone] ? FAX [phone] Web site: [email or web address]  

## 2021-03-14 NOTE — Consult Note (Signed)
Medical Consultation  Barbara Haynes TJQ:[id number] DOB: [birth year] DOA: 03/14/2021 PCP: Lavinia Sharps, NP   Requesting physician: Dr. Carolynne Edouard Date of consultation: 03/14/21 Reason for consultation: Medical mgmt  Impression/Recommendations Abdominal pain Elevated LFTs     - admit to surgery service     - plan is for cholecystectomy this afternoon     - She has had no issues with anesthesia in the past. She is able to carry on normal daily activity (ie walk long distances, climb flights of stairs) w/o CP or exertional dyspnea. Her EKG is ok.     - Her Chales Abrahams Cardiac Risk Assessment is 0.1% for this surgery. She is considered low risk     - will give her some potassium to optimize for surgery  Hypokalemia     - replace K+, check Mg2+  Hx of SVT     - resume home regimen  Anxiety/Depression     - resume home regimen  CKD3a     - she is at baseline, follow  Hyperglycemia     - no history of DM     - check A1c  TRH will follow-up again tomorrow. Please contact me if I can be of assistance in the meanwhile. Thank you for this consultation.  Chief Complaint: Abdominal pain  HPI:  Barbara Haynes is a 60 y.o. female with medical history significant of SVT, migraines, vertigo, anxiety/depression. Presenting with abdominal pain. It started suddenly around 2am this morning while she was sleeping. It was epigastric and RUQ wrapping around the right side to her right flank. It was sharp and constant. She is not aware of anything that helped the pain. She is not aware of anything that made it worse. As her pain continued through the morning, she alerted her husband. He brought her to the ED. She denies any recent diet changes. She denies any history of ulcer disease or GERD. She denies any recent spicy food. She denies any other aggravating or alleviating factors.   In the ED, she was found to have elevated LFTs and bilirubin. An [place] was performed that showed multiple gallstones but no inflammation.  CBD was normal, but there was concern for early obstruction. General surgery was consulted. The patient was evaluated and placed on the schedule for cholecystectomy. TRH was consulted for medical management and risk stratification.  Review of Systems:  Denies CP, palpitations, dyspnea, lightheadedness, dizziness, N/V/D, fever, sick contacts. Remainder of ROS is negative for all not mentioned in HPI.  Past Medical History:  Diagnosis Date   Fibromyalgia    Hypoglycemia    Migraines    SVT (supraventricular tachycardia) (HCC)    Past Surgical History:  Procedure Laterality Date   TONSILLECTOMY     Social History:  reports that she has never smoked. She has never used smokeless tobacco. She reports that she does not drink alcohol and does not use drugs.  Allergies  Allergen Reactions   Erythromycin Anaphylaxis   Lisinopril Anaphylaxis and Swelling    Tongue swelling and lips tingling and throat feels scratchy   Adhesive [Tape] Other (See Comments)    Skin peels easily   Benadryl [Diphenhydramine]     Skin crawling feeling    Sulfa Antibiotics Other (See Comments)    Skin crawling feeling    Family History  Problem Relation Age of Onset   Supraventricular tachycardia Mother    Supraventricular tachycardia Other     Prior to Admission medications   Medication Sig Start Date End Date  Taking? Authorizing Provider  atenolol (TENORMIN) 100 MG tablet Take 100 mg by mouth 2 (two) times daily. 02/14/21  Yes [provider]  buPROPion (WELLBUTRIN SR) 150 MG 12 hr tablet Take 1 tablet (150 mg total) by mouth 2 (two) times daily. 11/25/20  Yes Zena Amos, MD  losartan (COZAAR) 25 MG tablet Take 25 mg by mouth daily.   Yes [provider]  sertraline (ZOLOFT) 100 MG tablet Take 1 tablet (100 mg total) by mouth in the morning. 11/25/20  Yes Zena Amos, MD  amoxicillin-clavulanate (AUGMENTIN) 875-125 MG tablet Take 1 tablet by mouth every 12 (twelve) hours. Patient not  taking: Reported on 03/14/2021 02/24/21   Shanon Ace, PA-C   Physical Exam: Blood pressure (!) 100/59, pulse 71, temperature (!) 97.4 F (36.3 C), temperature source Oral, resp. rate 18, height 5\' 2"  (1.575 m), weight 61.2 kg, SpO2 98 %. Vitals:   03/14/21 0901 03/14/21 1000  BP: 107/60 (!) 100/59  Pulse: 79 71  Resp: 15 18  Temp:    SpO2: 98% 98%    General: 60 y.o. female resting in bed in NAD Eyes: PERRL, normal sclera ENMT: Nares patent w/o discharge, orophaynx clear, dentition normal, ears w/o discharge/lesions/ulcers Neck: Supple, trachea midline Cardiovascular: RRR, +S1, S2, no m/g/r, equal pulses throughout Respiratory: CTABL, no w/r/r, normal WOB GI: BS+, NDNT, no masses noted, no organomegaly noted MSK: No e/c/c Skin: No rashes, bruises, ulcerations noted Neuro: A&O x 3, no focal deficits Psyc: Appropriate interaction and affect, calm/cooperative  Labs on Admission:  Basic Metabolic Panel: Recent Labs  Lab 03/14/21 0530  NA 140  K 3.2*  CL 105  CO2 23  GLUCOSE 157*  BUN 22*  CREATININE 1.18*  CALCIUM 9.1   Liver Function Tests: Recent Labs  Lab 03/14/21 0530  AST 270*  ALT 105*  ALKPHOS 74  BILITOT 2.1*  PROT 7.0  ALBUMIN 4.1   Recent Labs  Lab 03/14/21 0530  LIPASE 34   No results for input(s): AMMONIA in the last 168 hours. CBC: Recent Labs  Lab 03/14/21 0530  WBC 13.7*  HGB 13.3  HCT 38.0  MCV 91.6  PLT 301   Cardiac Enzymes: No results for input(s): CKTOTAL, CKMB, CKMBINDEX, TROPONINI in the last 168 hours. BNP: Invalid input(s): POCBNP CBG: No results for input(s): GLUCAP in the last 168 hours.  Radiological Exams on Admission: DG Chest 2 View  Result Date: 03/14/2021 CLINICAL DATA:  Chest pain EXAM: CHEST - 2 VIEW COMPARISON:  10/03/2018 FINDINGS: Normal heart size and mediastinal contours. No acute infiltrate or edema. No effusion or pneumothorax. No acute osseous findings. IMPRESSION: Negative chest.  Electronically Signed   By: 12/02/2018 M.D.   On: 03/14/2021 05:35   03/16/2021 Abdomen Limited  Result Date: 03/14/2021 CLINICAL DATA:  Right upper quadrant pain EXAM: ULTRASOUND ABDOMEN LIMITED RIGHT UPPER QUADRANT COMPARISON:  None. FINDINGS: Gallbladder: Small layering calculi measured at up to 1 cm. Pericholecystic borderline wall thickening without edema fluid. There is focal tenderness per sonographer exam Common bile duct: Diameter: 3 mm Liver: No focal lesion identified. Within normal limits in parenchymal echogenicity. Portal vein is patent on color Doppler imaging with normal direction of blood flow towards the liver. IMPRESSION: Cholelithiasis. There is gallbladder tenderness but no visible wall inflammation typical of acute cholecystitis. Early gallbladder obstruction is possible. Electronically Signed   By: 03/16/2021 M.D.   On: 03/14/2021 08:42    EKG: Independently reviewed. Sinus, no st elevations  Time spent:  45 minutes  Teddy Spike DO [facility]  If 7PM-7AM, please contact night-coverage [email or web address] 03/14/2021, 11:29 AM

## 2021-03-14 NOTE — Anesthesia Procedure Notes (Signed)
Procedure Name: Intubation Date/Time: 03/14/2021 3:10 PM Performed by: Raenette Rover, CRNA Pre-anesthesia Checklist: Patient identified, Emergency Drugs available, Suction available and Patient being monitored Patient Re-evaluated:Patient Re-evaluated prior to induction Oxygen Delivery Method: Circle system utilized Preoxygenation: Pre-oxygenation with 100% oxygen Induction Type: IV induction Ventilation: Mask ventilation without difficulty Laryngoscope Size: Mac and 3 Grade View: Grade I Tube type: Oral Tube size: 7.0 mm Number of attempts: 1 Airway Equipment and Method: Stylet Placement Confirmation: ETT inserted through vocal cords under direct vision, positive ETCO2 and breath sounds checked- equal and bilateral Secured at: 21 cm Tube secured with: Tape Dental Injury: Teeth and Oropharynx as per pre-operative assessment

## 2021-03-14 NOTE — ED Notes (Signed)
OR reports Pt is currently scheduled for 1425.

## 2021-03-14 NOTE — Interval H&P Note (Signed)
History and Physical Interval Note:  03/14/2021 2:23 PM  Barbara Haynes  has presented today for surgery, with the diagnosis of ACUTE CHOLECYSTITIS.  The various methods of treatment have been discussed with the patient and family. After consideration of risks, benefits and other options for treatment, the patient has consented to  Procedure(s): LAPAROSCOPIC CHOLECYSTECTOMY WITH POSSIBLE INTRAOPERATIVE CHOLANGIOGRAM (N/A) as a surgical intervention.  The patient's history has been reviewed, patient examined, no change in status, stable for surgery.  I have reviewed the patient's chart and labs.  Questions were answered to the patient's satisfaction.     [name]

## 2021-03-14 NOTE — Consult Note (Signed)
[facility] Admission Note  Barbara Haynes 10/27/1960  [id number].    Requesting MD: Bowie Tran  PA-C Chief Complaint: sharp sternal chest pain that appeared sudden while asleep Reason for Consult: cholelithiasis, cholecystitis  HPI:  Patient is a 60-year-old female presenting to the ED with complaints of sharp central sternal chest pain that started suddenly this a.m. around 2 AM while sleeping.  She presented to the ED around 5 AM.  Her partner reported that she woke up with right chest pain breathing heavily and numbness in both arms and legs.  Pain then localized to the right lower chest and underneath her ribs.  She was reported tearful and the symptoms have persisted since onset when she presented to the ED.  Currently she reports that her pain in primarily under her ribs on the right and goes to her back.   She has had one similar episode that she has described as costochondritis.    Work-up in the ED:She is afebrile and her vital signs are stable.  Labs shows a potassium of 3.2, glucose 157, creatinine 1.18, w AST 220, ALT 105, total bilirubin 2.1.  Lipase 34.  WBC 13.7, hemoglobin 13.3, hematocrit 38.0.  hCG 6.3.  Chest x-ray was unremarkable.  Abdominal ultrasound showed a small N calculi measuring up to 1 cm.  Pericholecystic borderline wall thickening without edema.  Focal tenderness over the gallbladder.  CBD was 3 mm.  EKG shows sinus rhythm PR interval 152 ms, QTC 457 ms, occasional PVCs rate of 79.  Additional medical history includes SVT, fibromyalgia, major depression, and Migraines We are asked to see.  ROS: Review of Systems  Constitutional: Negative.   HENT: Negative.    Eyes: Negative.   Respiratory: Negative.    Cardiovascular:  Positive for chest pain (pain was really upper abdomen, and now localized to RUQ to back). Negative for palpitations, orthopnea, claudication, leg swelling and PND.  Gastrointestinal:  Positive for abdominal pain (both sides of upper  abdomen, going to her back.  "under her ribs."). Negative for blood in stool, constipation, diarrhea, heartburn, melena, nausea and vomiting.       No hx of colonoscopy  Genitourinary: Negative.   Musculoskeletal:  Positive for joint pain (fibromyalgia - shoulders and hands).  Skin: Negative.   Neurological:  Positive for headaches (intremittent migraines, with some recently).  Endo/Heme/Allergies: Negative.   Psychiatric/Behavioral:  Positive for depression.    Family History  Problem Relation Age of Onset   Supraventricular tachycardia Mother    Supraventricular tachycardia Other     Past Medical History:  Diagnosis Date   Fibromyalgia    Hypoglycemia    Migraines    SVT (supraventricular tachycardia) (HCC)     Past Surgical History:  Procedure Laterality Date   TONSILLECTOMY      Social History:  reports that she has never smoked. She has never used smokeless tobacco. She reports that she does not drink alcohol and does not use drugs. 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 Authorizing Provider  acetaminophen (TYLENOL) 500 MG tablet Take 1,000 mg by mouth every 6 (six) hours as needed for mild pain or headache.     [provider]  amoxicillin-clavulanate (AUGMENTIN) 875-125 MG tablet Take 1 tablet by mouth every 12 (twelve) hours. 02/24/21   Walisiewicz, Kaitlyn E, PA-C  atenolol (TENORMIN) 50 MG tablet Take 100 mg by mouth 2 (two) times daily.    [provider]  buPROPion  (WELLBUTRIN SR) 150 MG 12 hr tablet Take 1 tablet (150 mg total) by mouth 2 (two) times daily. 11/25/20   Zena Amos, MD  losartan (COZAAR) 25 MG tablet Take 25 mg by mouth daily.    [provider]  meloxicam (MOBIC) 15 MG tablet Take 15 mg by mouth daily.  09/23/18   [provider]  Nutritional Supplements (VITAMIN D BOOSTER PO) Take 2,000 tablets by mouth 2 (two) times daily.     [provider]  omeprazole (PRILOSEC) 10 MG capsule Take 10 mg by mouth daily.    [provider]  PROVENTIL HFA 108 (90 Base) MCG/ACT inhaler Inhale 2 puffs into the lungs 4 (four) times daily as needed for shortness of breath. 09/23/18   [provider]  sertraline (ZOLOFT) 100 MG tablet Take 1 tablet (100 mg total) by mouth in the morning. 11/25/20   Zena Amos, MD  tiZANidine (ZANAFLEX) 4 MG tablet Take 2 mg by mouth at bedtime.     [provider]     Blood pressure (!) 100/59, pulse 71, temperature (!) 97.4 F (36.3 C), temperature source Oral, resp. rate 18, height 5\' 2"  (1.575 m), weight 61.2 kg, SpO2 98 %. Physical Exam:  General: pleasant, WD, WN white female who is laying in bed in NAD HEENT: head is normocephalic, atraumatic.  Sclera are noninjected.  PERRL.  Ears and nose without any masses or lesions.  Mouth is pink and moist Heart: regular, rate, and rhythm.  Normal s1,s2. No obvious murmurs, gallops, or rubs noted.  Palpable radial and pedal pulses bilaterally Lungs: CTAB, no wheezes, rhonchi, or rales noted.  Respiratory effort nonlabored Abd: soft, she is mildly tender to palpation RUQ, no other tenderness, ND, +BS, no masses, hernias, or organomegaly MS: all 4 extremities are symmetrical with no cyanosis, clubbing, or edema. Skin: warm and dry with no masses, lesions, or rashes Neuro: Cranial nerves 2-12 grossly intact, sensation is normal throughout Psych: A&Ox3 with an appropriate affect.   Results for orders placed or performed during  the hospital encounter of 03/14/21 (from the past 48 hour(s))  I-Stat beta hCG blood, ED     Status: Abnormal   Collection Time: 03/14/21  5:29 AM  Result Value Ref Range   I-stat hCG, quantitative 6.3 (H) <5 mIU/mL   Comment 3            Comment:   GEST. AGE      CONC.  (mIU/mL)   <=1 WEEK        5 - 50     2 WEEKS       50 - 500     3 WEEKS       100 - 10,000     4 WEEKS     1,000 - 30,000        FEMALE AND NON-PREGNANT FEMALE:     LESS THAN 5 mIU/mL   Basic metabolic panel     Status: Abnormal   Collection Time: 03/14/21  5:30 AM  Result Value Ref Range   Sodium 140 135 - 145 mmol/L   Potassium 3.2 (L) 3.5 - 5.1 mmol/L   Chloride 105 98 - 111 mmol/L   CO2 23 22 - 32 mmol/L   Glucose, Bld 157 (H) 70 - 99 mg/dL    Comment: Glucose reference range applies only to samples taken after fasting for at least 8 hours.   BUN 22 (H) 6 - 20 mg/dL   Creatinine, Ser 5.95 (H) 0.44 - 1.00 mg/dL   Calcium 9.1 8.9 - 63.8 mg/dL   GFR, Estimated 53 (L) >60 mL/min    Comment: (NOTE) Calculated using the CKD-EPI Creatinine Equation (2021)    Anion gap 12 5 - 15    Comment: Performed at [facility], [address] [place], [place] [address]  CBC     Status: Abnormal   Collection Time: 03/14/21  5:30 AM  Result Value Ref Range   WBC 13.7 (H) 4.0 - 10.5 K/uL   RBC 4.15 3.87 - 5.11 MIL/uL   Hemoglobin 13.3 12.0 - 15.0 g/dL   HCT 32.9 51.8 - 84.1 %   MCV 91.6 80.0 - 100.0 fL   MCH 32.0 26.0 - 34.0 pg   MCHC 35.0 30.0 - 36.0 g/dL   RDW 66.0 63.0 - 16.0 %   Platelets 301 150 - 400 K/uL   nRBC 0.0 0.0 - 0.2 %    Comment: Performed at [facility], [address], [place], [place] [address]  Troponin I (High Sensitivity)     Status: None   Collection Time: 03/14/21  5:30 AM  Result Value Ref Range   Troponin I (High Sensitivity) <2 <18 ng/L    Comment: (NOTE) Elevated high sensitivity troponin I (hsTnI) values and significant  changes across serial  measurements may suggest ACS but many other  chronic and acute conditions are known to elevate hsTnI results.  Refer to the "Links" section for chest pain algorithms and additional  guidance. Performed at [facility], [address], [place], [place] [address]   Hepatic function panel     Status: Abnormal   Collection Time: 03/14/21  5:30 AM  Result Value Ref Range   Total Protein 7.0 6.5 - 8.1 g/dL   Albumin 4.1 3.5 - 5.0 g/dL   AST 220 (H) 15 - 41 U/L   ALT 105 (H) 0 - 44 U/L   Alkaline Phosphatase 74 38 - 126 U/L   Total Bilirubin 2.1 (H) 0.3 - 1.2 mg/dL   Bilirubin, Direct 1.0 (H) 0.0 - 0.2 mg/dL   Indirect Bilirubin 1.1 (H) 0.3 - 0.9 mg/dL    Comment: Performed at [facility], [address], [place], [place] [address]  Lipase, blood     Status: None   Collection Time: 03/14/21  5:30 AM  Result Value Ref Range   Lipase 34 11 - 51 U/L    Comment: Performed at [facility], [address], [place], [place] [address]   DG Chest 2 View  Result Date: 03/14/2021 CLINICAL DATA:  Chest pain EXAM: CHEST - 2 VIEW COMPARISON:  10/03/2018 FINDINGS: Normal heart size and mediastinal contours. No acute infiltrate or edema. No effusion or pneumothorax. No acute osseous findings. IMPRESSION: Negative chest. Electronically Signed   By: Marnee Spring M.D.   On: 03/14/2021 05:35   US Abdomen Limited  Result Date: 03/14/2021 CLINICAL DATA:  Right upper quadrant pain EXAM: ULTRASOUND ABDOMEN LIMITED  RIGHT UPPER QUADRANT COMPARISON:  None. FINDINGS: Gallbladder: Small layering calculi measured at up to 1 cm. Pericholecystic borderline wall thickening without edema fluid. There is focal tenderness per sonographer exam Common bile duct: Diameter: 3 mm Liver: No focal lesion identified. Within normal limits in parenchymal echogenicity. Portal vein is patent on color Doppler imaging with normal direction of blood flow towards the liver.  IMPRESSION: Cholelithiasis. There is gallbladder tenderness but no visible wall inflammation typical of acute cholecystitis. Early gallbladder obstruction is possible. Electronically Signed   By: Marnee Spring M.D.   On: 03/14/2021 08:42      Assessment/Plan  Cholelithiasis/cholecystitis Elevated LFT's Hypokalemia  FEN:  NPO- last PO 7:30 PM last night ID:  Rocephin DVT:  SCD  Hx fibromyalgia Hx depression Hx SVT Hx migraine   Plan:  Being seen by Medicine; Dr. Ronaldo Miyamoto.  If we get medical clearance we will plan to do laparoscopic cholecystectomy with possible IOC later this PM.  The risk and benefits were discussed in detail, and patient was agreeable.  K+ is low an being replaced.    Sherrie George [facility] 03/14/2021, 10:22 AM Please see Amion for pager number during day hours 7:00am-4:30pm

## 2021-03-14 NOTE — Op Note (Signed)
03/14/2021  4:24 PM  PATIENT:  Barbara Haynes  60 y.o. female  PRE-OPERATIVE DIAGNOSIS:  GALLSTONES  POST-OPERATIVE DIAGNOSIS:  GALLSTONES  PROCEDURE:  Procedure(s): LAPAROSCOPIC CHOLECYSTECTOMY WITH INTRAOPERATIVE CHOLANGIOGRAM (N/A)  SURGEON:  Surgeon(s) and Role:    * Griselda Miner, MD - Primary  PHYSICIAN ASSISTANT:   ASSISTANTS: Leary Roca, PA   ANESTHESIA:   local and general  EBL:  20 mL   BLOOD ADMINISTERED:none  DRAINS: none   LOCAL MEDICATIONS USED:  MARCAINE     SPECIMEN:  Source of Specimen:  gallbladder  DISPOSITION OF SPECIMEN:  PATHOLOGY  COUNTS:  YES  TOURNIQUET:  * No tourniquets in log *  DICTATION: .Dragon Dictation    Procedure: After informed consent was obtained the patient was brought to the operating room and placed in the supine position on the operating room table. After adequate induction of general anesthesia the patient's abdomen was prepped with ChloraPrep allowed to dry and draped in usual sterile manner. An appropriate timeout was performed. The area below the umbilicus was infiltrated with quarter percent  Marcaine. A small incision was made with a 15 blade knife. The incision was carried down through the subcutaneous tissue bluntly with a hemostat and [employer] retractors. The linea alba was identified. The linea alba was incised with a 15 blade knife and each side was grasped with Coker clamps. The preperitoneal space was then probed with a hemostat until the peritoneum was opened and access was gained to the abdominal cavity. A 0 Vicryl pursestring stitch was placed in the fascia surrounding the opening. A Hassan cannula was then placed through the opening and anchored in place with the previously placed Vicryl purse string stitch. The abdomen was insufflated with carbon dioxide without difficulty. A laparoscope was inserted through the [facility] cannula in the right upper quadrant was inspected. Next the epigastric region was infiltrated  with % Marcaine. A small incision was made with a 15 blade knife. A 5 mm port was placed bluntly through this incision into the abdominal cavity under direct vision. Next 2 sites were chosen laterally on the right side of the abdomen for placement of 5 mm ports. Each of these areas was infiltrated with quarter percent Marcaine. Small stab incisions were made with a 15 blade knife. 5 mm ports were then placed bluntly through these incisions into the abdominal cavity under direct vision without difficulty. A blunt grasper was placed through the lateralmost 5 mm port and used to grasp the dome of the gallbladder and elevated anteriorly and superiorly. Another blunt grasper was placed through the other 5 mm port and used to retract the body and neck of the gallbladder. A dissector was placed through the epigastric port and using the electrocautery the peritoneal reflection at the gallbladder neck was opened. Blunt dissection was then carried out in this area until the gallbladder neck-cystic duct junction was readily identified and a good window was created. A single clip was placed on the gallbladder neck. A small  ductotomy was made just below the clip with laparoscopic scissors. A 14-gauge Angiocath was then placed through the anterior abdominal wall under direct vision. A Reddick cholangiogram catheter was then placed through the Angiocath and flushed. The catheter was then placed in the cystic duct and anchored in place with a clip. A cholangiogram was obtained that showed no filling defects good emptying into the duodenum an adequate length on the cystic duct. The anchoring clip and catheters were then removed from the patient. 3 clips  were placed proximally on the cystic duct and the duct was divided between the 2 sets of clips. Posterior to this the cystic artery was identified and again dissected bluntly in a circumferential manner until a good window  was created. 2 clips were placed proximally and one  distally on the artery and the artery was divided between the 2 sets of clips. Next a laparoscopic hook cautery device was used to separate the gallbladder from the liver bed. Prior to completely detaching the gallbladder from the liver bed the liver bed was inspected and several small bleeding points were coagulated with the electrocautery until the area was completely hemostatic. The gallbladder was then detached the rest of it from the liver bed without difficulty. A laparoscopic bag was inserted through the hassan port. The laparoscope was moved to the epigastric port. The gallbladder was placed within the bag and the bag was sealed.  The bag with the gallbladder was then removed with the [facility] cannula through the infraumbilical port without difficulty. The fascial defect was then closed with the previously placed Vicryl pursestring stitch as well as with another figure-of-eight 0 Vicryl stitch. The liver bed was inspected again and found to be hemostatic. The abdomen was irrigated with copious amounts of saline until the effluent was clear. The ports were then removed under direct vision without difficulty and were found to be hemostatic. The gas was allowed to escape. The skin incisions were all closed with interrupted 4-0 Monocryl subcuticular stitches. Dermabond dressings were applied. The patient tolerated the procedure well. At the end of the case all needle sponge and instrument counts were correct. The patient was then awakened and taken to recovery in stable condition   PLAN OF CARE: Admit to inpatient   PATIENT DISPOSITION:  PACU - hemodynamically stable.   Delay start of Pharmacological VTE agent (>24hrs) due to surgical blood loss or risk of bleeding: no

## 2021-03-14 NOTE — Anesthesia Preprocedure Evaluation (Addendum)
Anesthesia Evaluation  Patient identified by MRN, date of birth, ID band Patient awake    Reviewed: Allergy & Precautions, NPO status , Patient's Chart, lab work & pertinent test results, reviewed documented beta blocker date and time   Airway Mallampati: III  TM Distance: >3 FB Neck ROM: Full    Dental  (+) Teeth Intact, Dental Advisory Given   Pulmonary    breath sounds clear to auscultation       Cardiovascular hypertension, Pt. on medications and Pt. on home beta blockers + dysrhythmias Supra Ventricular Tachycardia  Rhythm:Regular Rate:Normal     Neuro/Psych  Headaches, PSYCHIATRIC DISORDERS Depression    GI/Hepatic negative GI ROS, Neg liver ROS,   Endo/Other  negative endocrine ROS  Renal/GU negative Renal ROS     Musculoskeletal  (+) Fibromyalgia -  Abdominal Normal abdominal exam  (+)   Peds  Hematology negative hematology ROS (+)   Anesthesia Other Findings Pt very sleepy  Reproductive/Obstetrics                             Anesthesia Physical Anesthesia Plan  ASA: 3  Anesthesia Plan: General   Post-op Pain Management:    Induction: Intravenous  PONV Risk Score and Plan: 4 or greater and Ondansetron, Dexamethasone and Treatment may vary due to age or medical condition  Airway Management Planned: Oral ETT  Additional Equipment: None  Intra-op Plan:   Post-operative Plan: Extubation in OR  Informed Consent: I have reviewed the patients History and Physical, chart, labs and discussed the procedure including the risks, benefits and alternatives for the proposed anesthesia with the patient or authorized representative who has indicated his/her understanding and acceptance.     Dental advisory given  Plan Discussed with: CRNA  Anesthesia Plan Comments:         Anesthesia Quick Evaluation

## 2021-03-14 NOTE — ED Notes (Addendum)
Short Stay Transport arrived to take Pt.  NS w/ of KCl sent w/ patient.  Pt belongings sent w/ Pt's husband.  Consent signed and sent w/ Pt.

## 2021-03-14 NOTE — H&P (View-Only) (Signed)
[facility] Admission Note  Barbara MathLisa Haynes 08/30/1960  [id number].    Requesting MD: Fayrene HelperBowie Tran  PA-C Chief Complaint: sharp sternal chest pain that appeared sudden while asleep Reason for Consult: cholelithiasis, cholecystitis  HPI:  Patient is a 60 year old female presenting to the ED with complaints of sharp central sternal chest pain that started suddenly this a.m. around 2 AM while sleeping.  She presented to the ED around 5 AM.  Her partner reported that she woke up with right chest pain breathing heavily and numbness in both arms and legs.  Pain then localized to the right lower chest and underneath her ribs.  She was reported tearful and the symptoms have persisted since onset when she presented to the ED.  Currently she reports that her pain in primarily under her ribs on the right and goes to her back.   She has had one similar episode that she has described as costochondritis.    Work-up in the ED:She is afebrile and her vital signs are stable.  Labs shows a potassium of 3.2, glucose 157, creatinine 1.18, w AST 220, ALT 105, total bilirubin 2.1.  Lipase 34.  WBC 13.7, hemoglobin 13.3, hematocrit 38.0.  hCG 6.3.  Chest x-ray was unremarkable.  Abdominal ultrasound showed a small N calculi measuring up to 1 cm.  Pericholecystic borderline wall thickening without edema.  Focal tenderness over the gallbladder.  CBD was 3 mm.  EKG shows sinus rhythm PR interval 152 ms, QTC 457 ms, occasional PVCs rate of 79.  Additional medical history includes SVT, fibromyalgia, major depression, and Migraines We are asked to see.  ROS: Review of Systems  Constitutional: Negative.   HENT: Negative.    Eyes: Negative.   Respiratory: Negative.    Cardiovascular:  Positive for chest pain (pain was really upper abdomen, and now localized to RUQ to back). Negative for palpitations, orthopnea, claudication, leg swelling and PND.  Gastrointestinal:  Positive for abdominal pain (both sides of upper  abdomen, going to her back.  "under her ribs."). Negative for blood in stool, constipation, diarrhea, heartburn, melena, nausea and vomiting.       No hx of colonoscopy  Genitourinary: Negative.   Musculoskeletal:  Positive for joint pain (fibromyalgia - shoulders and hands).  Skin: Negative.   Neurological:  Positive for headaches (intremittent migraines, with some recently).  Endo/Heme/Allergies: Negative.   Psychiatric/Behavioral:  Positive for depression.    Family History  Problem Relation Age of Onset   Supraventricular tachycardia Mother    Supraventricular tachycardia Other     Past Medical History:  Diagnosis Date   Fibromyalgia    Hypoglycemia    Migraines    SVT (supraventricular tachycardia) (HCC)     Past Surgical History:  Procedure Laterality Date   TONSILLECTOMY      Social History:  reports that she has never smoked. She has never used smokeless tobacco. She reports that she does not drink alcohol and does not use drugs. 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 Authorizing Provider  acetaminophen (TYLENOL) 500 MG tablet Take 1,000 mg by mouth every 6 (six) hours as needed for mild pain or headache.     [provider]  amoxicillin-clavulanate (AUGMENTIN) 875-125 MG tablet Take 1 tablet by mouth every 12 (twelve) hours. 02/24/21   Walisiewicz, Kaitlyn E, PA-C  atenolol (TENORMIN) 50 MG tablet Take 100 mg by mouth 2 (two) times daily.    [provider]  buPROPion  (WELLBUTRIN SR) 150 MG 12 hr tablet Take 1 tablet (150 mg total) by mouth 2 (two) times daily. 11/25/20   Zena Amos, MD  losartan (COZAAR) 25 MG tablet Take 25 mg by mouth daily.    [provider]  meloxicam (MOBIC) 15 MG tablet Take 15 mg by mouth daily.  09/23/18   [provider]  Nutritional Supplements (VITAMIN D BOOSTER PO) Take 2,000 tablets by mouth 2 (two) times daily.     [provider]  omeprazole (PRILOSEC) 10 MG capsule Take 10 mg by mouth daily.    [provider]  PROVENTIL HFA 108 (90 Base) MCG/ACT inhaler Inhale 2 puffs into the lungs 4 (four) times daily as needed for shortness of breath. 09/23/18   [provider]  sertraline (ZOLOFT) 100 MG tablet Take 1 tablet (100 mg total) by mouth in the morning. 11/25/20   Zena Amos, MD  tiZANidine (ZANAFLEX) 4 MG tablet Take 2 mg by mouth at bedtime.     [provider]     Blood pressure (!) 100/59, pulse 71, temperature (!) 97.4 F (36.3 C), temperature source Oral, resp. rate 18, height 5\' 2"  (1.575 m), weight 61.2 kg, SpO2 98 %. Physical Exam:  General: pleasant, WD, WN white female who is laying in bed in NAD HEENT: head is normocephalic, atraumatic.  Sclera are noninjected.  PERRL.  Ears and nose without any masses or lesions.  Mouth is pink and moist Heart: regular, rate, and rhythm.  Normal s1,s2. No obvious murmurs, gallops, or rubs noted.  Palpable radial and pedal pulses bilaterally Lungs: CTAB, no wheezes, rhonchi, or rales noted.  Respiratory effort nonlabored Abd: soft, she is mildly tender to palpation RUQ, no other tenderness, ND, +BS, no masses, hernias, or organomegaly MS: all 4 extremities are symmetrical with no cyanosis, clubbing, or edema. Skin: warm and dry with no masses, lesions, or rashes Neuro: Cranial nerves 2-12 grossly intact, sensation is normal throughout Psych: A&Ox3 with an appropriate affect.   Results for orders placed or performed during  the hospital encounter of 03/14/21 (from the past 48 hour(s))  I-Stat beta hCG blood, ED     Status: Abnormal   Collection Time: 03/14/21  5:29 AM  Result Value Ref Range   I-stat hCG, quantitative 6.3 (H) <5 mIU/mL   Comment 3            Comment:   GEST. AGE      CONC.  (mIU/mL)   <=1 WEEK        5 - 50     2 WEEKS       50 - 500     3 WEEKS       100 - 10,000     4 WEEKS     1,000 - 30,000        FEMALE AND NON-PREGNANT FEMALE:     LESS THAN 5 mIU/mL   Basic metabolic panel     Status: Abnormal   Collection Time: 03/14/21  5:30 AM  Result Value Ref Range   Sodium 140 135 - 145 mmol/L   Potassium 3.2 (L) 3.5 - 5.1 mmol/L   Chloride 105 98 - 111 mmol/L   CO2 23 22 - 32 mmol/L   Glucose, Bld 157 (H) 70 - 99 mg/dL    Comment: Glucose reference range applies only to samples taken after fasting for at least 8 hours.   BUN 22 (H) 6 - 20 mg/dL   Creatinine, Ser 5.95 (H) 0.44 - 1.00 mg/dL   Calcium 9.1 8.9 - 63.8 mg/dL   GFR, Estimated 53 (L) >60 mL/min    Comment: (NOTE) Calculated using the CKD-EPI Creatinine Equation (2021)    Anion gap 12 5 - 15    Comment: Performed at [facility], [address] [place], [place] [address]  CBC     Status: Abnormal   Collection Time: 03/14/21  5:30 AM  Result Value Ref Range   WBC 13.7 (H) 4.0 - 10.5 K/uL   RBC 4.15 3.87 - 5.11 MIL/uL   Hemoglobin 13.3 12.0 - 15.0 g/dL   HCT 32.9 51.8 - 84.1 %   MCV 91.6 80.0 - 100.0 fL   MCH 32.0 26.0 - 34.0 pg   MCHC 35.0 30.0 - 36.0 g/dL   RDW 66.0 63.0 - 16.0 %   Platelets 301 150 - 400 K/uL   nRBC 0.0 0.0 - 0.2 %    Comment: Performed at [facility], [address], [place], [place] [address]  Troponin I (High Sensitivity)     Status: None   Collection Time: 03/14/21  5:30 AM  Result Value Ref Range   Troponin I (High Sensitivity) <2 <18 ng/L    Comment: (NOTE) Elevated high sensitivity troponin I (hsTnI) values and significant  changes across serial  measurements may suggest ACS but many other  chronic and acute conditions are known to elevate hsTnI results.  Refer to the "Links" section for chest pain algorithms and additional  guidance. Performed at [facility], [address], [place], [place] [address]   Hepatic function panel     Status: Abnormal   Collection Time: 03/14/21  5:30 AM  Result Value Ref Range   Total Protein 7.0 6.5 - 8.1 g/dL   Albumin 4.1 3.5 - 5.0 g/dL   AST 220 (H) 15 - 41 U/L   ALT 105 (H) 0 - 44 U/L   Alkaline Phosphatase 74 38 - 126 U/L   Total Bilirubin 2.1 (H) 0.3 - 1.2 mg/dL   Bilirubin, Direct 1.0 (H) 0.0 - 0.2 mg/dL   Indirect Bilirubin 1.1 (H) 0.3 - 0.9 mg/dL    Comment: Performed at [facility], [address], [place], [place] [address]  Lipase, blood     Status: None   Collection Time: 03/14/21  5:30 AM  Result Value Ref Range   Lipase 34 11 - 51 U/L    Comment: Performed at [facility], [address], [place], [place] [address]   DG Chest 2 View  Result Date: 03/14/2021 CLINICAL DATA:  Chest pain EXAM: CHEST - 2 VIEW COMPARISON:  10/03/2018 FINDINGS: Normal heart size and mediastinal contours. No acute infiltrate or edema. No effusion or pneumothorax. No acute osseous findings. IMPRESSION: Negative chest. Electronically Signed   By: Marnee Spring M.D.   On: 03/14/2021 05:35   US Abdomen Limited  Result Date: 03/14/2021 CLINICAL DATA:  Right upper quadrant pain EXAM: ULTRASOUND ABDOMEN LIMITED  RIGHT UPPER QUADRANT COMPARISON:  None. FINDINGS: Gallbladder: Small layering calculi measured at up to 1 cm. Pericholecystic borderline wall thickening without edema fluid. There is focal tenderness per sonographer exam Common bile duct: Diameter: 3 mm Liver: No focal lesion identified. Within normal limits in parenchymal echogenicity. Portal vein is patent on color Doppler imaging with normal direction of blood flow towards the liver.  IMPRESSION: Cholelithiasis. There is gallbladder tenderness but no visible wall inflammation typical of acute cholecystitis. Early gallbladder obstruction is possible. Electronically Signed   By: Marnee Spring M.D.   On: 03/14/2021 08:42      Assessment/Plan  Cholelithiasis/cholecystitis Elevated LFT's Hypokalemia  FEN:  NPO- last PO 7:30 PM last night ID:  Rocephin DVT:  SCD  Hx fibromyalgia Hx depression Hx SVT Hx migraine   Plan:  Being seen by Medicine; Dr. Ronaldo Miyamoto.  If we get medical clearance we will plan to do laparoscopic cholecystectomy with possible IOC later this PM.  The risk and benefits were discussed in detail, and patient was agreeable.  K+ is low an being replaced.    Sherrie George [facility] 03/14/2021, 10:22 AM Please see Amion for pager number during day hours 7:00am-4:30pm

## 2021-03-15 ENCOUNTER — Encounter (HOSPITAL_COMMUNITY): Payer: Self-pay | Admitting: General Surgery

## 2021-03-15 DIAGNOSIS — F32A Depression, unspecified: Secondary | ICD-10-CM

## 2021-03-15 DIAGNOSIS — K8021 Calculus of gallbladder without cholecystitis with obstruction: Secondary | ICD-10-CM

## 2021-03-15 DIAGNOSIS — K8020 Calculus of gallbladder without cholecystitis without obstruction: Secondary | ICD-10-CM

## 2021-03-15 DIAGNOSIS — I471 Supraventricular tachycardia: Secondary | ICD-10-CM

## 2021-03-15 DIAGNOSIS — F419 Anxiety disorder, unspecified: Secondary | ICD-10-CM

## 2021-03-15 DIAGNOSIS — R748 Abnormal levels of other serum enzymes: Secondary | ICD-10-CM

## 2021-03-15 DIAGNOSIS — I10 Essential (primary) hypertension: Secondary | ICD-10-CM

## 2021-03-15 LAB — COMPREHENSIVE METABOLIC PANEL
ALT: 381 U/L — ABNORMAL HIGH (ref 0–44)
AST: 306 U/L — ABNORMAL HIGH (ref 15–41)
Albumin: 3.4 g/dL — ABNORMAL LOW (ref 3.5–5.0)
Alkaline Phosphatase: 90 U/L (ref 38–126)
Anion gap: 6 (ref 5–15)
BUN: 12 mg/dL (ref 6–20)
CO2: 26 mmol/L (ref 22–32)
Calcium: 8.5 mg/dL — ABNORMAL LOW (ref 8.9–10.3)
Chloride: 109 mmol/L (ref 98–111)
Creatinine, Ser: 0.78 mg/dL (ref 0.44–1.00)
GFR, Estimated: >60 mL/min (ref >60)
Glucose, Bld: 108 mg/dL — ABNORMAL HIGH (ref 70–99)
Potassium: 4.1 mmol/L (ref 3.5–5.1)
Sodium: 141 mmol/L (ref 135–145)
Total Bilirubin: 0.9 mg/dL (ref 0.3–1.2)
Total Protein: 5.8 g/dL — ABNORMAL LOW (ref 6.5–8.1)

## 2021-03-15 LAB — HEMOGLOBIN A1C
Hgb A1c MFr Bld: 4.9 % (ref 4.8–5.6)
Mean Plasma Glucose: 94 mg/dL

## 2021-03-15 MED ORDER — HYDROCODONE-ACETAMINOPHEN 5-325 MG PO TABS: 1.0000 | ORAL_TABLET | Freq: Four times a day (QID) | ORAL | 0 refills | Status: AC | PRN

## 2021-03-15 MED ORDER — PANTOPRAZOLE SODIUM 40 MG PO TBEC: 40.0000 mg | DELAYED_RELEASE_TABLET | Freq: Every day | ORAL | Status: DC

## 2021-03-15 NOTE — Discharge Summary (Signed)
Physician Discharge Summary  Patient ID: Shiva Karis MRN: [id number] DOB/AGE: 10/06/1960 60 y.o.  Admit date: 03/14/2021 Discharge date: 03/15/2021  Admission Diagnoses:  Discharge Diagnoses:  Active Problems:   Abdominal pain   Gallstones   Discharged Condition: good  Hospital Course: Admitted with acute cholecystitis.  Taken the operating for surgery.  Postoperative day 1, she was doing well.  Her pain was controlled and she was tolerating a diet.  The decision was made to discharge home  Consults: None  Significant Diagnostic Studies:   Treatments: surgery: lap chole  Discharge Exam: Blood pressure 98/65, pulse 75, temperature 98 F (36.7 C), temperature source Oral, resp. rate 18, height 5\' 2"  (1.575 m), weight 61.2 kg, SpO2 98 %. General appearance: alert, cooperative, and no distress Resp: clear to auscultation bilaterally Cardio: regular rate and rhythm, S1, S2 normal, no murmur, click, rub or gallop Incision/Wound:abdomen soft, incisions clean  Disposition: Discharge disposition: 01-Home or Self Care        Allergies as of 03/15/2021       Reactions   Erythromycin Anaphylaxis   Lisinopril Anaphylaxis, Swelling   Tongue swelling and lips tingling and throat feels scratchy   Adhesive [tape] Other (See Comments)   Skin peels easily   Benadryl [diphenhydramine]    Skin crawling feeling    Sulfa Antibiotics Other (See Comments)   Skin crawling feeling         Medication List     STOP taking these medications    amoxicillin-clavulanate 875-125 MG tablet Commonly known as: AUGMENTIN       TAKE these medications    atenolol 100 MG tablet Commonly known as: TENORMIN Take 100 mg by mouth 2 (two) times daily.   buPROPion 150 MG 12 hr tablet Commonly known as: Wellbutrin SR Take 1 tablet (150 mg total) by mouth 2 (two) times daily.   HYDROcodone-acetaminophen 5-325 MG tablet Commonly known as: NORCO/VICODIN Take 1 tablet by mouth every 6 (six)  hours as needed for moderate pain.   losartan 25 MG tablet Commonly known as: COZAAR Take 25 mg by mouth daily.   sertraline 100 MG tablet Commonly known as: ZOLOFT Take 1 tablet (100 mg total) by mouth in the morning.        Follow-up Information     [facility] 03/17/2021 Follow up on 04/01/2021.   Specialty: General Surgery Why: your appointment is at 10:15 AM.  Be at the office 30 minutes early for check in.  Bring photo ID and insurance information. Contact information: [address] [place] [phone]                 Signed: [phone] 03/15/2021, 10:03 AM

## 2021-03-15 NOTE — Progress Notes (Signed)

## 2021-03-15 NOTE — Progress Notes (Signed)
Nurse reviewed discharge instructions with pt.  Pt verbalized understanding of discharge instructions, follow up appointments and new medications.  Pt given pneumonia vaccine prior to discharge.  No concerns at discharge.

## 2021-03-15 NOTE — Progress Notes (Signed)
PROGRESS NOTE  Barbara Haynes IZT:[id number] DOB: 03/29/1961   PCP: Lavinia Sharps, NP  Patient is from: Home  DOA: 03/14/2021 LOS: 0  Chief complaints:  Chief Complaint  Patient presents with   Chest Pain     Brief Narrative / Interim history: 60 year old F with PMH of SVT, migraines, vertigo, anxiety and depression admitted with epigastric and RUQ pain and found to have cholelithiasis and possible early gallbladder obstruction.  Hospitalist service consulted for medical management and risk stratification. She underwent laparoscopic cholecystectomy without complication.    Subjective: Seen and examined earlier this morning.  No major events overnight of this morning.  No complaints.  Pain fairly controlled.  Denies nausea or vomiting.  Ambulated in the hallway without problem.  Denies chest pain, dyspnea, palpitation or dizziness.  Objective: Vitals:   03/14/21 2146 03/15/21 0151 03/15/21 0600 03/15/21 0955  BP: 108/67 (!) 91/54 (!) 100/57 98/65  Pulse: 67 72 73 75  Resp: 18 18 18 18   Temp: 98.6 F (37 C) 98.5 F (36.9 C) 98.6 F (37 C) 98 F (36.7 C)  TempSrc: Oral Oral Oral Oral  SpO2: 100% 98% 96% 98%  Weight:      Height:        Intake/Output Summary (Last 24 hours) at 03/15/2021 1127 Last data filed at 03/15/2021 0600 Gross per 24 hour  Intake 2140 ml  Output 2070 ml  Net 70 ml   Filed Weights   03/14/21 0511  Weight: 61.2 kg    Examination:  GENERAL: No apparent distress.  Nontoxic. HEENT: MMM.  Vision and hearing grossly intact.  NECK: Supple.  No apparent JVD.  RESP: On RA.  No IWOB.  Fair aeration bilaterally. CVS:  RRR. Heart sounds normal.  ABD/GI/GU: BS+. Abd soft.  Mild discomfort with palpation. MSK/EXT:  Moves extremities. No apparent deformity. No edema.  SKIN: no apparent skin lesion or wound NEURO: Awake, alert and oriented appropriately.  No apparent focal neuro deficit. PSYCH: Calm. Normal affect.   Procedures:  7/22-laparoscopic  cholecystectomy with cholangiogram  Microbiology summarized: COVID-19 and influenza PCR nonreactive.  Assessment & Plan: History of supraventricular tachycardia-stable. -Continue home atenolol 100 mg twice daily-has been taking this for years  Anxiety and depr Zoloft and Wellbutrin  Essential hypertension: Normotensive. -Continue home losartan  History of vertigo: Stable  Cholelithiasis with early possible gallbladder obstruction Elevated liver enzymes/hyperbilirubinemia-likely due to cholelithiasis.  Hyperbilirubinemia resolved. -Recheck CMP in 1 to 2 weeks   Body mass index is 24.69 kg/m.         DVT prophylaxis:  Subcu heparin  Code Status: Full code Family Communication: Patient and/or RN. Available if any question.  Level of care: Med-Surg  Disposition: Patient is medically stable for discharge on home medication from our standpoint.   Sch Meds:  Scheduled Meds:  atenolol  100 mg Oral BID   buPROPion  150 mg Oral BID   heparin  5,000 Units Subcutaneous Q8H   losartan  25 mg Oral Daily   pantoprazole  40 mg Oral QHS   sertraline  100 mg Oral Daily   Continuous Infusions:  0.9 % NaCl with KCl 40 mEq / L 50 mL/hr at 03/14/21 1703   PRN Meds:.HYDROcodone-acetaminophen, morphine injection, ondansetron **OR** ondansetron (ZOFRAN) IV  Antimicrobials: Anti-infectives (From admission, onward)    Start     Dose/Rate Route Frequency Ordered Stop   03/14/21 1015  cefTRIAXone (ROCEPHIN) 2 g in sodium chloride 0.9 % 100 mL IVPB  2 g 200 mL/hr over 30 Minutes Intravenous  Once 03/14/21 1010 03/14/21 1316        I have personally reviewed the following labs and images: CBC: Recent Labs  Lab 03/14/21 0530  WBC 13.7*  HGB 13.3  HCT 38.0  MCV 91.6  PLT 301   BMP &GFR Recent Labs  Lab 03/14/21 0530 03/14/21 1242 03/15/21 0343  NA 140  --  141  K 3.2*  --  4.1  CL 105  --  109  CO2 23  --  26  GLUCOSE 157*  --  108*  BUN 22*  --  12   CREATININE 1.18*  --  0.78  CALCIUM 9.1  --  8.5*  MG  --  2.2  --    Estimated Creatinine Clearance: 65.1 mL/min (by C-G formula based on SCr of 0.78 mg/dL). Liver & Pancreas: Recent Labs  Lab 03/14/21 0530 03/15/21 0343  AST 270* 306*  ALT 105* 381*  ALKPHOS 74 90  BILITOT 2.1* 0.9  PROT 7.0 5.8*  ALBUMIN 4.1 3.4*   Recent Labs  Lab 03/14/21 0530  LIPASE 34   No results for input(s): AMMONIA in the last 168 hours. Diabetic: Recent Labs    03/14/21 1242  HGBA1C 4.9   No results for input(s): GLUCAP in the last 168 hours. Cardiac Enzymes: No results for input(s): CKTOTAL, CKMB, CKMBINDEX, TROPONINI in the last 168 hours. No results for input(s): PROBNP in the last 8760 hours. Coagulation Profile: No results for input(s): INR, PROTIME in the last 168 hours. Thyroid Function Tests: No results for input(s): TSH, T4TOTAL, FREET4, T3FREE, THYROIDAB in the last 72 hours. Lipid Profile: No results for input(s): CHOL, HDL, LDLCALC, TRIG, CHOLHDL, LDLDIRECT in the last 72 hours. Anemia Panel: No results for input(s): VITAMINB12, FOLATE, FERRITIN, TIBC, IRON, RETICCTPCT in the last 72 hours. Urine analysis: No results found for: COLORURINE, APPEARANCEUR, LABSPEC, PHURINE, GLUCOSEU, HGBUR, BILIRUBINUR, KETONESUR, PROTEINUR, UROBILINOGEN, NITRITE, LEUKOCYTESUR Sepsis Labs: Invalid input(s): PROCALCITONIN, LACTICIDVEN  Microbiology: Recent Results (from the past 240 hour(s))  Resp Panel by RT-PCR (Flu A&B, Covid) Nasopharyngeal Swab     Status: None   Collection Time: 03/14/21 11:03 AM   Specimen: Nasopharyngeal Swab; Nasopharyngeal(NP) swabs in vial transport medium  Result Value Ref Range Status   SARS Coronavirus 2 by RT PCR NEGATIVE NEGATIVE Final    Comment: (NOTE) SARS-CoV-2 target nucleic acids are NOT DETECTED.  The SARS-CoV-2 RNA is generally detectable in upper respiratory specimens during the acute phase of infection. The lowest concentration of SARS-CoV-2  viral copies this assay can detect is 138 copies/mL. A negative result does not preclude SARS-Cov-2 infection and should not be used as the sole basis for treatment or other patient management decisions. A negative result may occur with  improper specimen collection/handling, submission of specimen other than nasopharyngeal swab, presence of viral mutation(s) within the areas targeted by this assay, and inadequate number of viral copies(<138 copies/mL). A negative result must be combined with clinical observations, patient history, and epidemiological information. The expected result is Negative.  Fact Sheet for Patients:  BloggerCourse.com  Fact Sheet for Healthcare Providers:  SeriousBroker.it  This test is no t yet approved or cleared by the [place] FDA and  has been authorized for detection and/or diagnosis of SARS-CoV-2 by FDA under an Emergency Use Authorization (EUA). This EUA will remain  in effect (meaning this test can be used) for the duration of the COVID-19 declaration under Section 564(b)(1) of the Act, 21  U.S.C.section 360bbb-3(b)(1), unless the authorization is terminated  or revoked sooner.       Influenza A by PCR NEGATIVE NEGATIVE Final   Influenza B by PCR NEGATIVE NEGATIVE Final    Comment: (NOTE) The Xpert Xpress SARS-CoV-2/FLU/RSV plus assay is intended as an aid in the diagnosis of influenza from Nasopharyngeal swab specimens and should not be used as a sole basis for treatment. Nasal washings and aspirates are unacceptable for Xpert Xpress SARS-CoV-2/FLU/RSV testing.  Fact Sheet for Patients: BloggerCourse.com  Fact Sheet for Healthcare Providers: SeriousBroker.it  This test is not yet approved or cleared by the [place] FDA and has been authorized for detection and/or diagnosis of SARS-CoV-2 by FDA under an Emergency Use Authorization  (EUA). This EUA will remain in effect (meaning this test can be used) for the duration of the COVID-19 declaration under Section 564(b)(1) of the Act, 21 U.S.C. section 360bbb-3(b)(1), unless the authorization is terminated or revoked.  Performed at [facility], [address] [place], [place] [address]     Radiology Studies: DG Cholangiogram Operative  Result Date: 03/14/2021 CLINICAL DATA:  60 year old female with cholelithiasis, recommended for intraoperative cholangiogram. EXAM: INTRAOPERATIVE CHOLANGIOGRAM COMPARISON:  Abdominal ultrasound, 03/14/2021. FLUOROSCOPY TIME:  Fluoroscopy Time:  25.6 Radiation Exposure Index (if provided by the fluoroscopic device): 5.98 mGy Number of Acquired Spot Images: 75 FINDINGS: Frontal planar fluoroscopic imaging with C-arm for intraoperative cholangiogram, demonstrating cystic and common bile duct duct opacification, without evidence of filling defect. IMPRESSION: 1. Fluoroscopic imaging for intraoperative cholangiogram. 2. No discrete filling defect within the biliary tree. Electronically Signed   By: Roanna Banning MD   On: 03/14/2021 16:17      Staphany Ditton T. Tytiana Coles [facility]  If 7PM-7AM, please contact night-coverage [email or web address] 03/15/2021, 11:27 AM

## 2021-03-18 LAB — SURGICAL PATHOLOGY

## 2021-04-29 DIAGNOSIS — Z1231 Encounter for screening mammogram for malignant neoplasm of breast: Secondary | ICD-10-CM

## 2021-06-24 ENCOUNTER — Other Ambulatory Visit: Payer: Self-pay | Admitting: Obstetrics and Gynecology

## 2021-06-24 DIAGNOSIS — Z1231 Encounter for screening mammogram for malignant neoplasm of breast: Secondary | ICD-10-CM

## 2021-08-07 ENCOUNTER — Ambulatory Visit: Payer: No Typology Code available for payment source

## 2021-09-25 ENCOUNTER — Other Ambulatory Visit: Payer: Self-pay

## 2021-09-25 ENCOUNTER — Ambulatory Visit: Payer: Self-pay | Admitting: *Deleted

## 2021-09-25 ENCOUNTER — Ambulatory Visit
Admission: RE | Admit: 2021-09-25 | Discharge: 2021-09-25 | Disposition: A | Discharge status: Home / Self Care | Payer: No Typology Code available for payment source | Source: Ambulatory Visit | Attending: Obstetrics and Gynecology | Admitting: Obstetrics and Gynecology

## 2021-09-25 VITALS — BP 114/72 | Wt 144.9 lb

## 2021-09-25 DIAGNOSIS — Z1239 Encounter for other screening for malignant neoplasm of breast: Secondary | ICD-10-CM

## 2021-09-25 DIAGNOSIS — Z1231 Encounter for screening mammogram for malignant neoplasm of breast: Secondary | ICD-10-CM

## 2021-09-25 DIAGNOSIS — Z1211 Encounter for screening for malignant neoplasm of colon: Secondary | ICD-10-CM

## 2021-09-25 NOTE — Patient Instructions (Signed)
Explained breast self awareness with Barbara Haynes. Patient did not need a Pap smear today due to last Pap smear was 02/16/2020 per patient. Let her know BCCCP will cover Pap smears every 3 years unless has a history of abnormal Pap smears. Referred patient to the [facility] for a screening mammogram on mobile unit. Appointment scheduled Thursday, September 25, 2021 at 1400. Patient aware of appointment and will be there. Let patient know the [facility] will follow up with her within the next couple weeks with results of her mammogram by letter or phone. Barbara Haynes verbalized understanding.  Barbara Haynes, Arvil Chaco, RN 2:38 PM

## 2021-09-25 NOTE — Progress Notes (Signed)
Barbara Haynes is a 61 y.o. female who presents to [facility] clinic today with no complaints.    Pap Smear: Pap smear not completed today. Last Pap smear was 02/16/2020 at [facility] clinic and was normal per patient. Per patient has a history of an abnormal Pap smear around 32 years ago that a repeat Pap smear was completed for follow-up. Per patient has had at least three normal Pap smears since abnormal. Last Pap smear result is not available in Epic.   Physical exam: Breasts Breasts symmetrical. No skin abnormalities bilateral breasts. No nipple retraction bilateral breasts. No nipple discharge bilateral breasts. No lymphadenopathy. No lumps palpated bilateral breasts. No complaints of pain or tenderness on exam.     MS DIGITAL SCREENING TOMO BILATERAL  Result Date: 03/26/2020 CLINICAL DATA:  Screening. EXAM: DIGITAL SCREENING BILATERAL MAMMOGRAM WITH TOMO AND CAD COMPARISON:  Previous exam(s). ACR Breast Density Category b: There are scattered areas of fibroglandular density. FINDINGS: There are no findings suspicious for malignancy. Images were processed with CAD. IMPRESSION: No mammographic evidence of malignancy. A result letter of this screening mammogram will be mailed directly to the patient. RECOMMENDATION: Screening mammogram in one year. (Code:SM-B-01Y) BI-RADS CATEGORY  1: Negative. Electronically Signed   By: Harmon Pier M.D.   On: 03/26/2020 09:31     Pelvic/Bimanual Pap is not indicated today per BCCCP guidelines.   Smoking History: Patient has never smoked.   Patient Navigation: Patient education provided. Access to services provided for patient through BCCCP program.   Colorectal Cancer Screening: Per patient has never had colonoscopy completed. FIT Test given to patient to complete. No complaints today.    Breast and Cervical Cancer Risk Assessment: Patient does not have family history of breast cancer, known genetic mutations, or radiation treatment to the chest before  age 26. Patient does not have history of cervical dysplasia, immunocompromised, or DES exposure in-utero.  Risk Assessment     Risk Scores       09/25/2021 03/21/2020   Last edited by: Narda Rutherford, LPN Meryl Dare, CMA   5-year risk: 1.5 % 1.5 %   Lifetime risk: 8.3 % 8.5 %            A: BCCCP exam without pap smear No complaints.  P: [facility] to the [facility] for a screening mammogram on mobile unit. Appointment scheduled Thursday, September 25, 2021 at 1400.  Priscille Heidelberg, RN 09/25/2021 2:22 PM

## 2021-09-29 ENCOUNTER — Other Ambulatory Visit: Payer: Self-pay | Admitting: Obstetrics and Gynecology

## 2021-09-29 DIAGNOSIS — R928 Other abnormal and inconclusive findings on diagnostic imaging of breast: Secondary | ICD-10-CM

## 2021-10-27 ENCOUNTER — Ambulatory Visit
Admission: RE | Admit: 2021-10-27 | Discharge: 2021-10-27 | Disposition: A | Discharge status: Home / Self Care | Payer: Self-pay | Source: Ambulatory Visit | Attending: Obstetrics and Gynecology | Admitting: Obstetrics and Gynecology

## 2021-10-27 ENCOUNTER — Ambulatory Visit
Admission: RE | Admit: 2021-10-27 | Discharge: 2021-10-27 | Disposition: A | Discharge status: Home / Self Care | Payer: No Typology Code available for payment source | Source: Ambulatory Visit | Attending: Obstetrics and Gynecology | Admitting: Obstetrics and Gynecology

## 2021-10-27 ENCOUNTER — Other Ambulatory Visit: Payer: Self-pay

## 2021-10-27 DIAGNOSIS — R928 Other abnormal and inconclusive findings on diagnostic imaging of breast: Secondary | ICD-10-CM

## 2022-03-01 IMAGING — US US ABDOMEN LIMITED
1 series · 14 of 25 positions shown · non-contrast
Comparison: None.

CLINICAL DATA: Right upper quadrant pain

EXAM:
ULTRASOUND ABDOMEN LIMITED RIGHT UPPER QUADRANT

[Series 1: us abdomen limited · 14 of 76 slices shown]
[im 1/76]
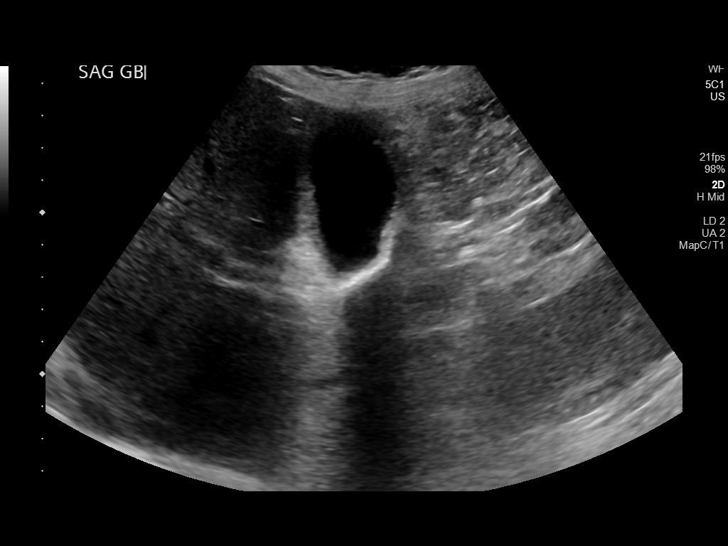
[im 7/76]
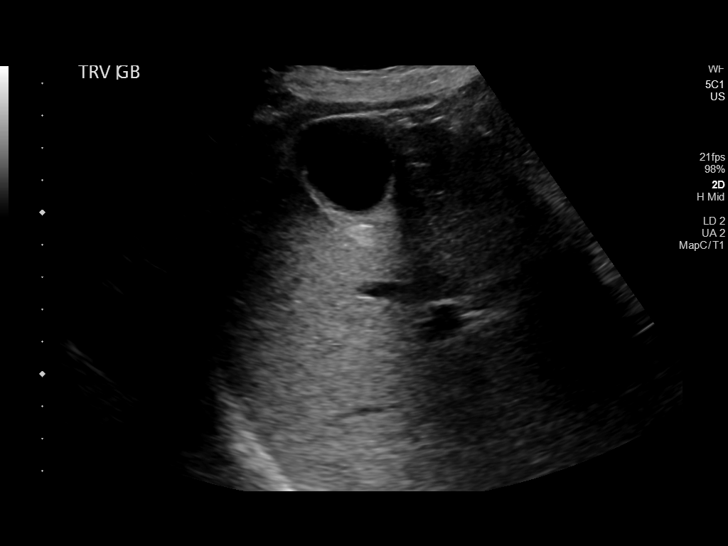
[im 13/76]
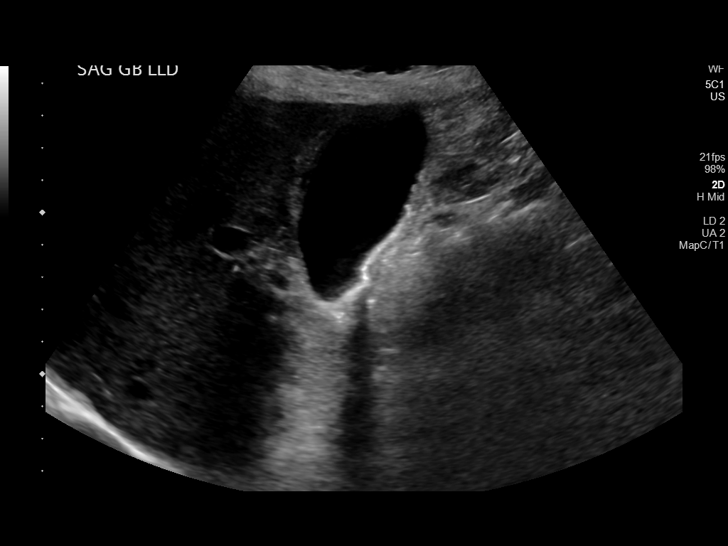
[im 19/76]
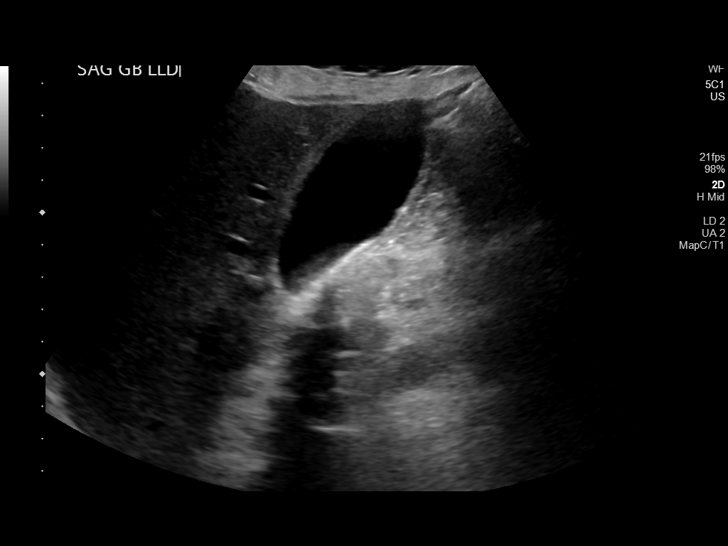
[im 26/76]
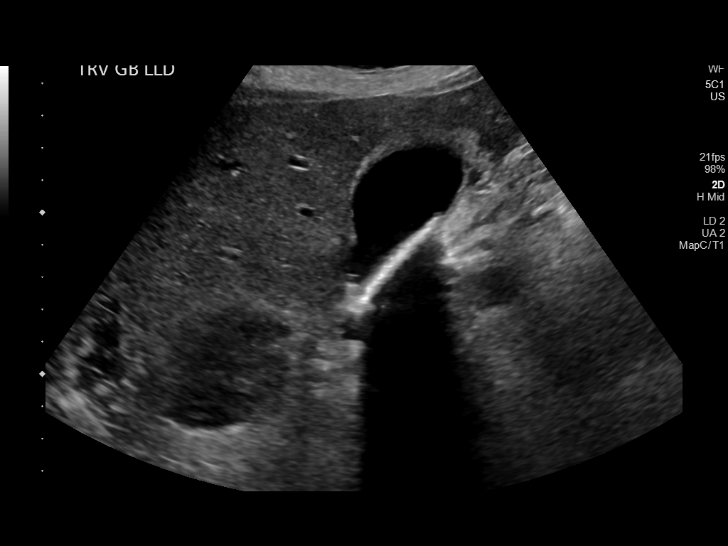
[im 29/76]
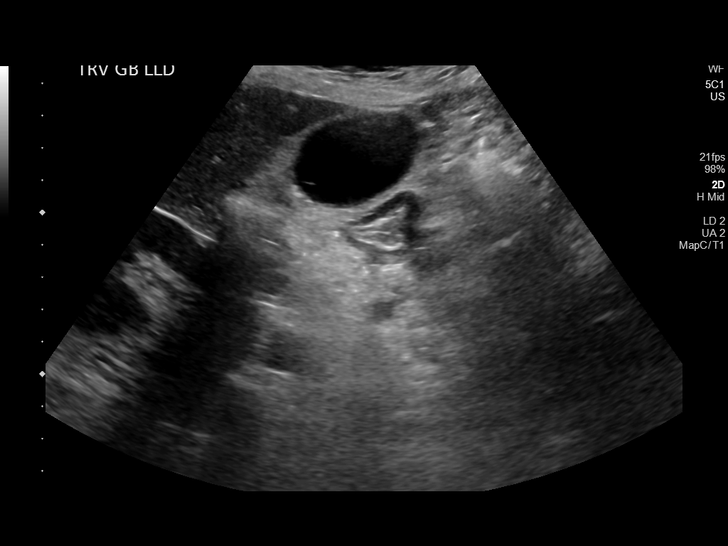
[im 35/76]
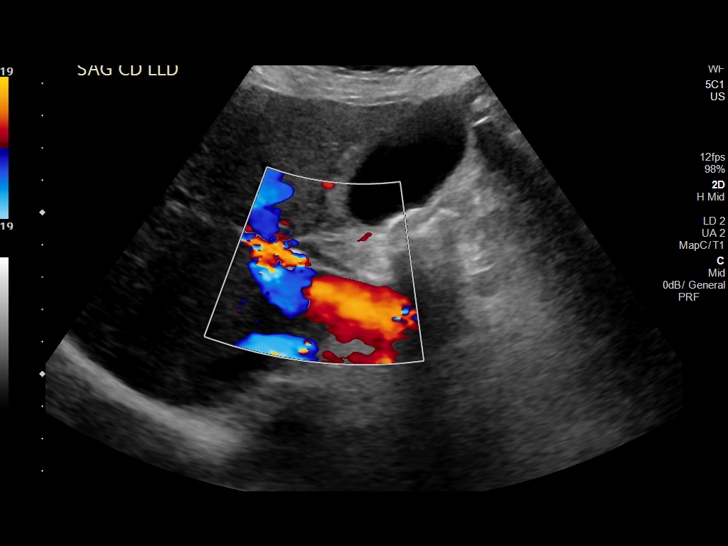
[im 41/76]
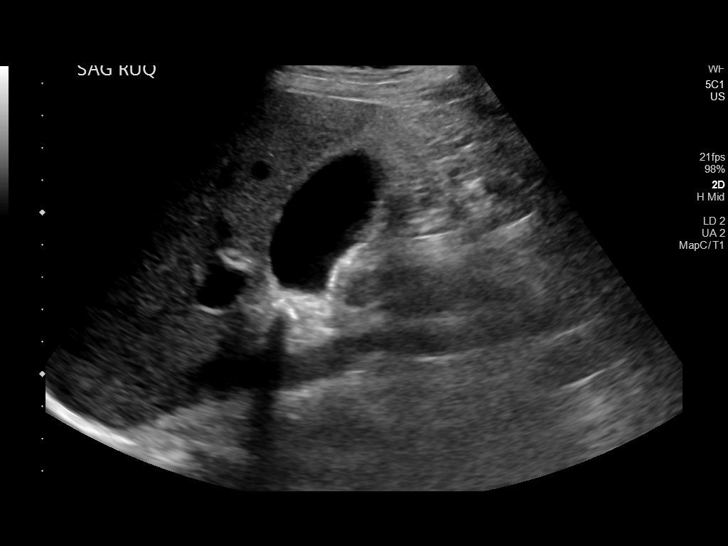
[im 47/76]
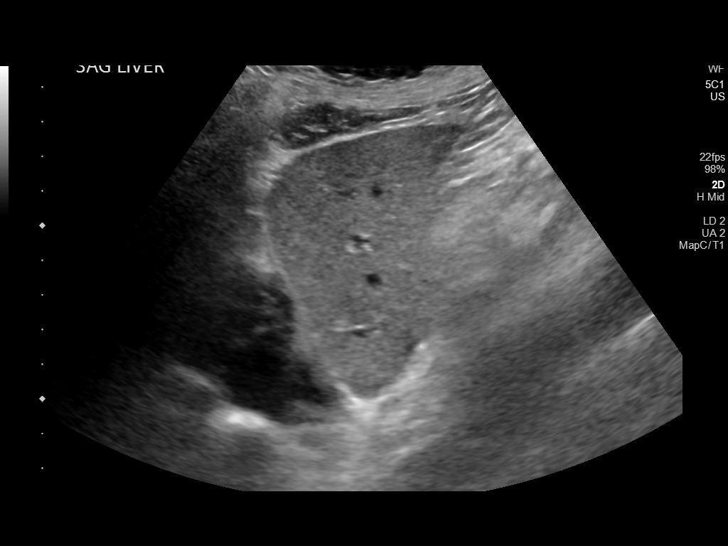
[im 51/76]
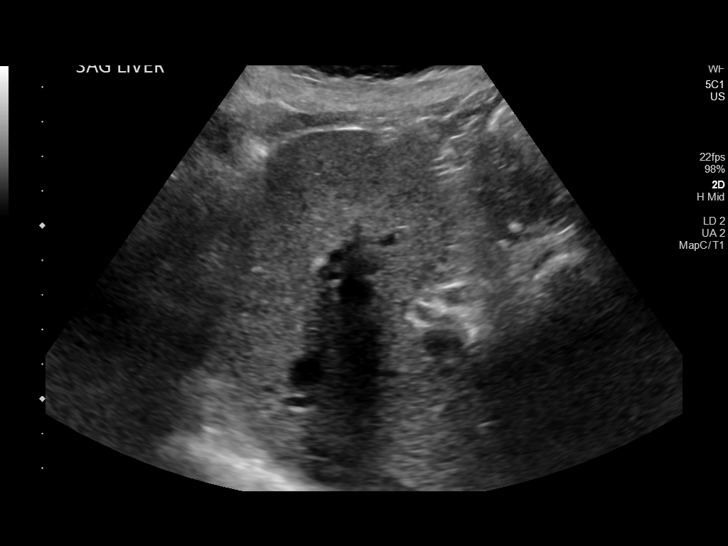
[im 57/76]
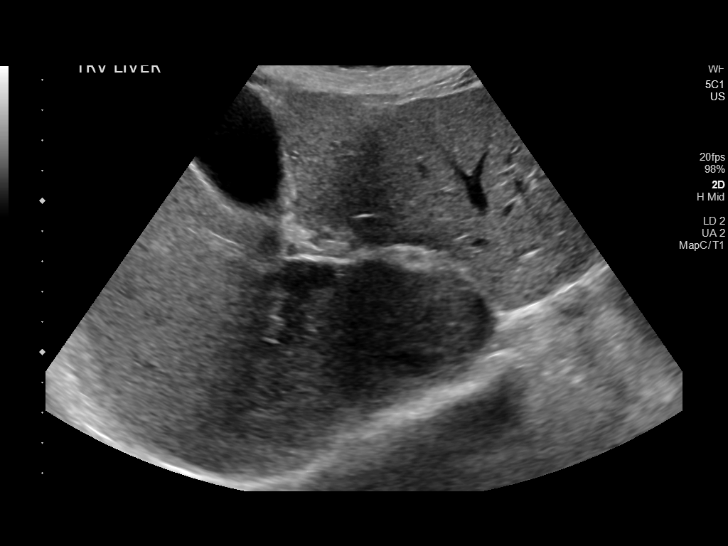
[im 63/76]
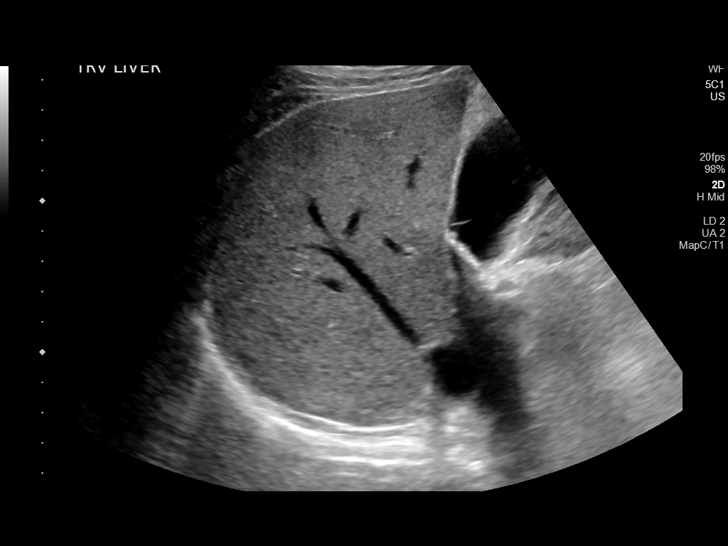
[im 69/76]
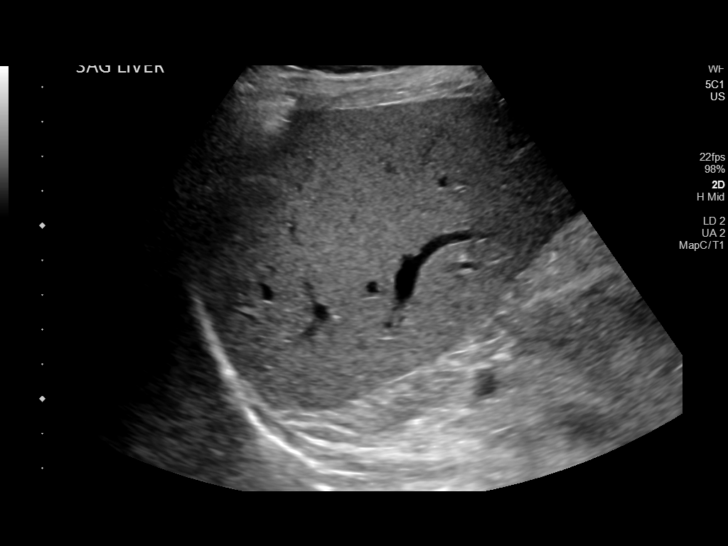
[im 76/76]
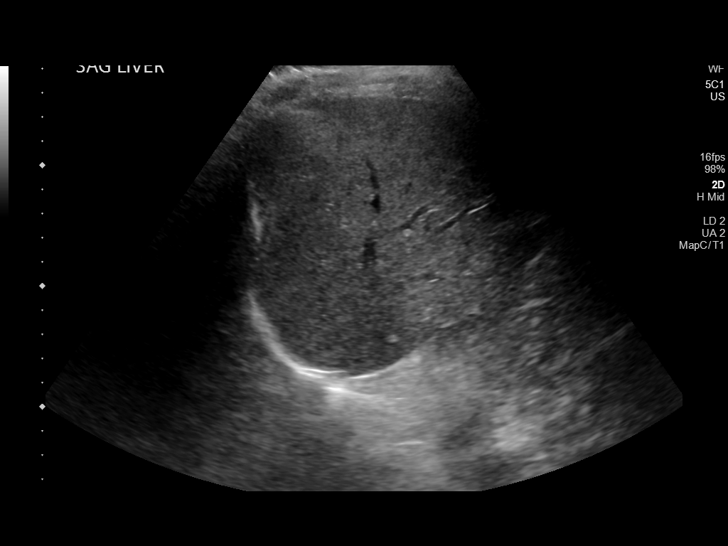

[14 of 25 positions shown; findings below may reference images not displayed]

FINDINGS: Gallbladder:

Small layering calculi measured at up to 1 cm. Pericholecystic
borderline wall thickening without edema fluid. There is focal
tenderness per sonographer exam

Common bile duct:

Diameter: 3 mm

Liver:

No focal lesion identified. Within normal limits in parenchymal
echogenicity. Portal vein is patent on color Doppler imaging with
normal direction of blood flow towards the liver.
IMPRESSION: Cholelithiasis. There is gallbladder tenderness but no visible wall
inflammation typical of acute cholecystitis. Early gallbladder
obstruction is possible.

## 2022-10-14 IMAGING — MG MM DIGITAL DIAGNOSTIC UNILAT*L* W/ TOMO W/ CAD
4 series · 4 of 12 positions shown · non-contrast
Comparison: Previous exam(s).

CLINICAL DATA: 60-year-old female recalled from screening mammogram
dated 09/25/2021 for possible left breast masses.

EXAM:
DIGITAL DIAGNOSTIC UNILATERAL LEFT MAMMOGRAM WITH TOMOSYNTHESIS AND
CAD; ULTRASOUND LEFT BREAST LIMITED
TECHNIQUE: Left digital diagnostic mammography and breast tomosynthesis was
performed. The images were evaluated with computer-aided detection.;
Targeted ultrasound examination of the left breast was performed.

[L MLO synth-2D]
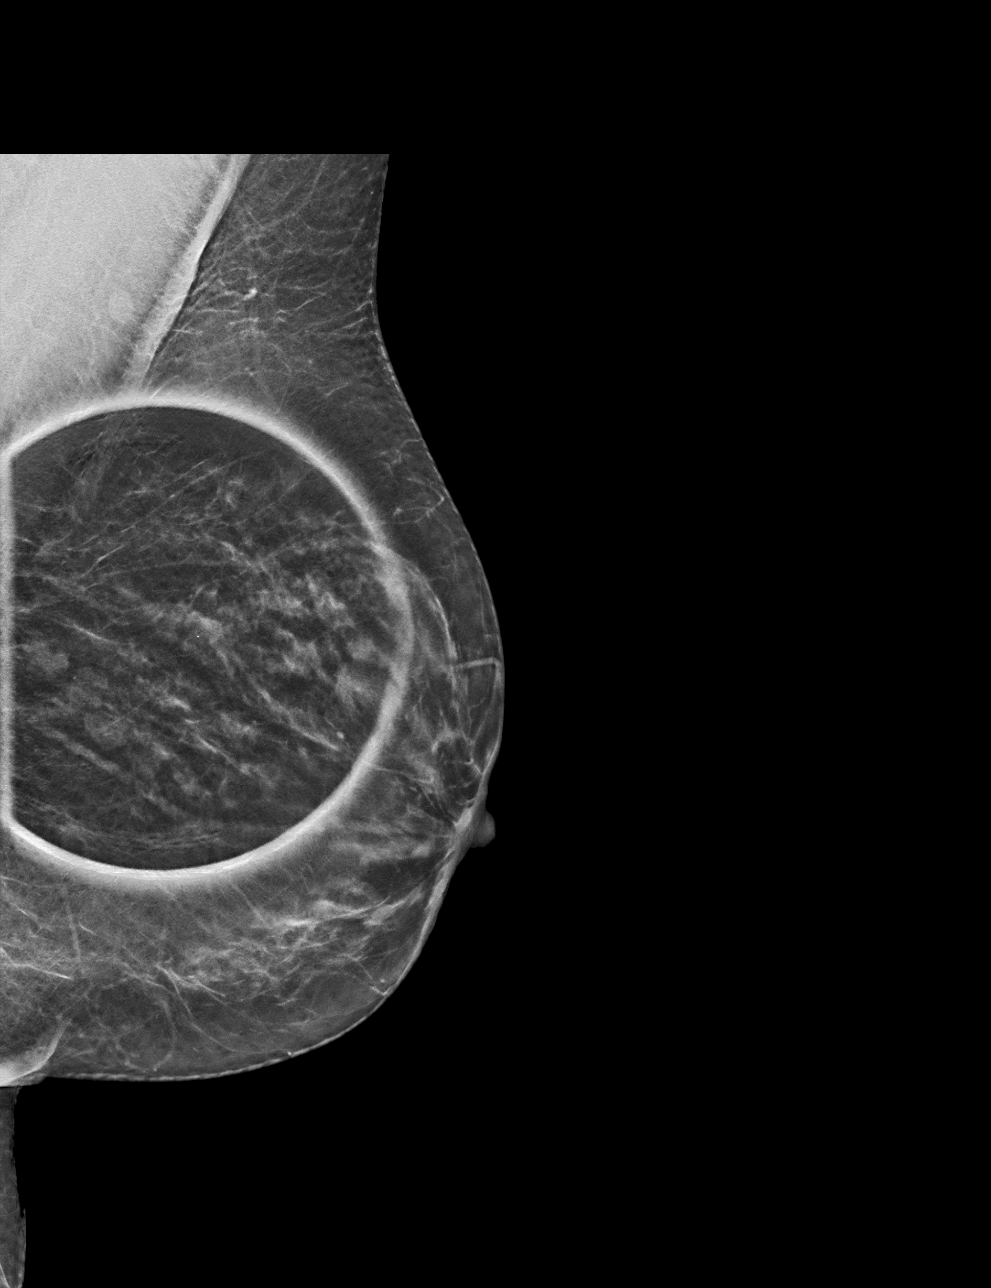

[L CC synth-2D]
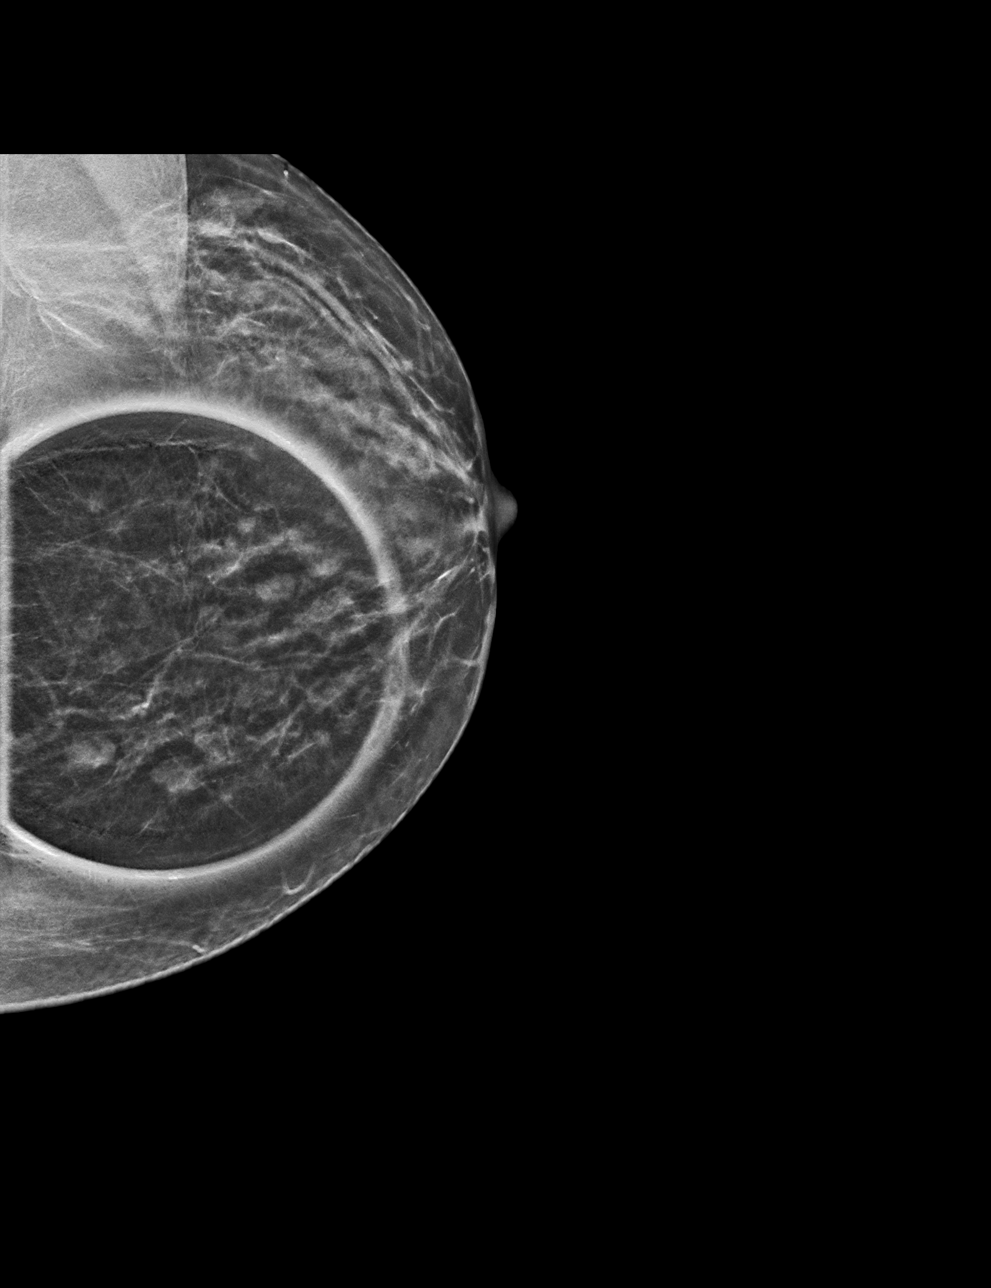

[L MLO tomo · tomo slice 23/45.0]
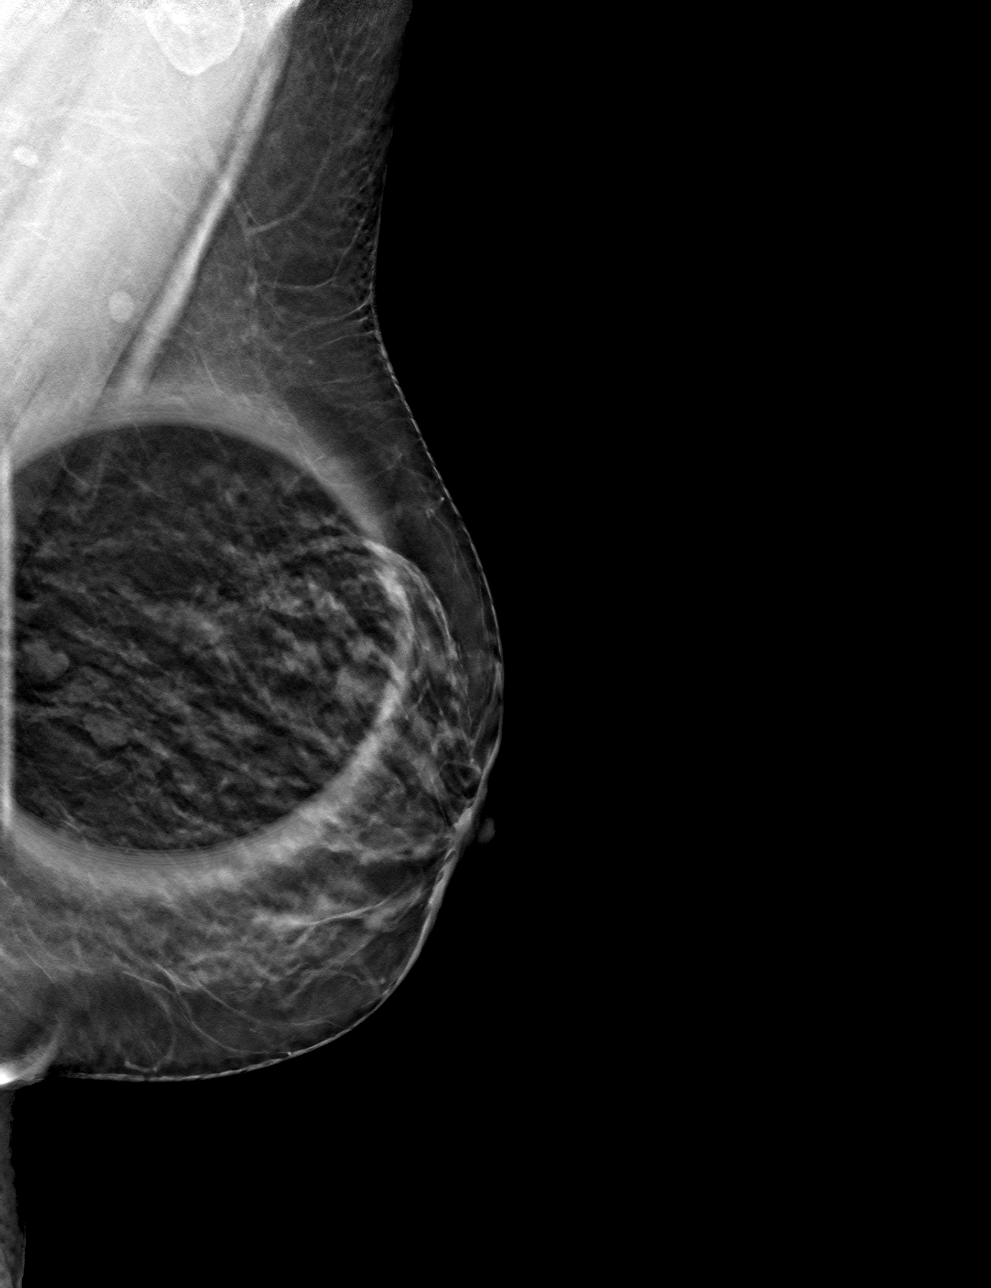

[L CC tomo · tomo slice 21/40.0]
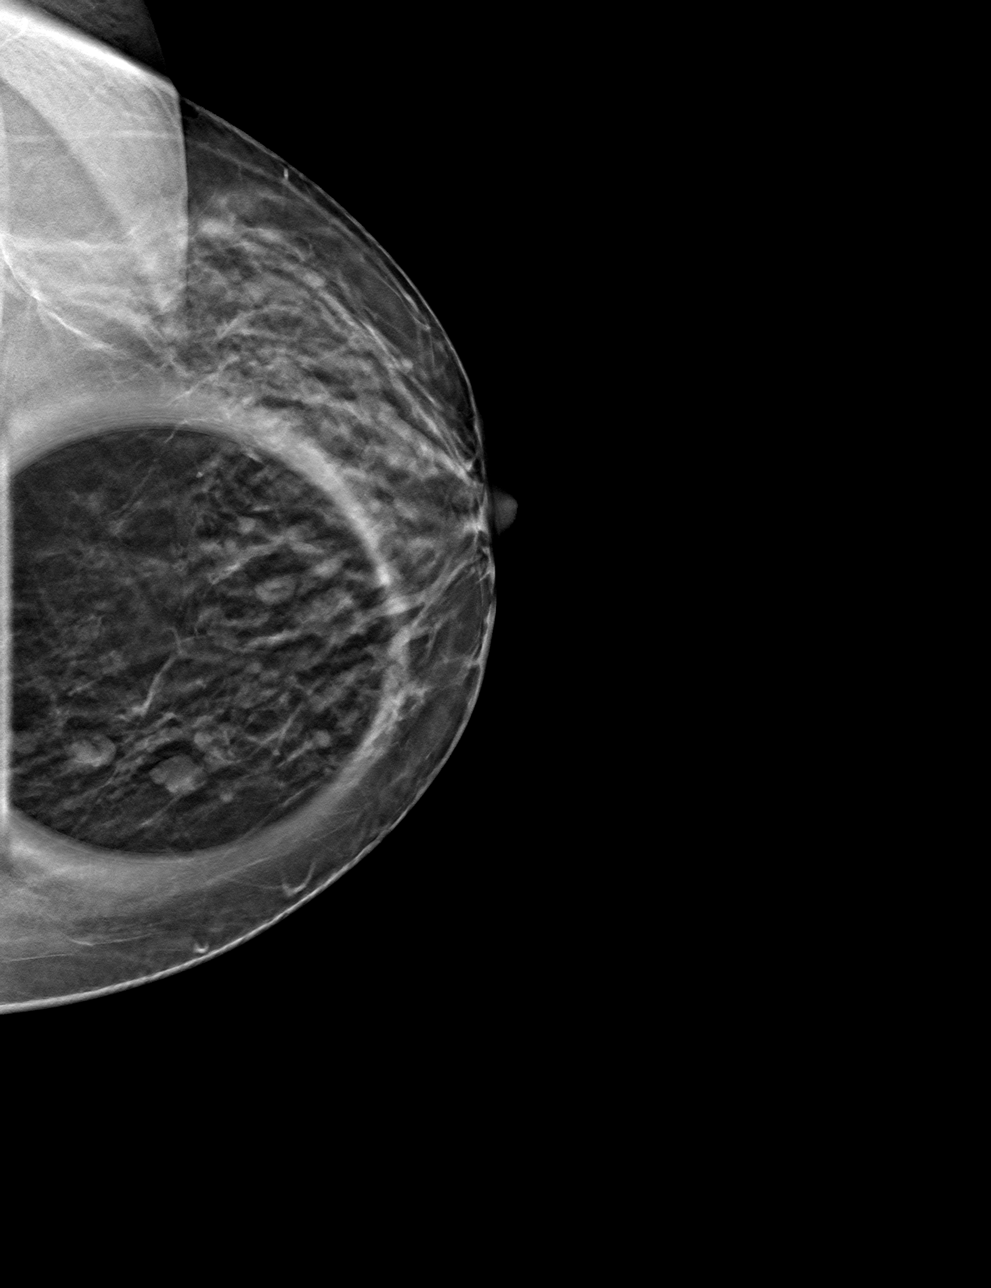

[4 of 12 positions shown; findings below may reference images not displayed]

ACR Breast Density Category c: The breast tissue is heterogeneously
dense, which may obscure small masses.
FINDINGS: There are multiple persistent oval, circumscribed equal density
masses in the medial left breast. Further evaluation with ultrasound
was performed.

Targeted ultrasound is performed, showing oval, circumscribed
anechoic cysts and clusters of cysts scattered throughout the upper
inner quadrant of the left breast. This includes a in 8 x 6 x 3 mm
simple cyst at the 10 o'clock position 4 cm from the nipple. A 7 x 4
x 7 mm cluster of cysts at the 10 o'clock position 5 cm from the
nipple and a 3 x 2 x 3 mm complicated cyst at the 10 o'clock
position 6 cm from the nipple. Additional scattered cysts throughout
the remainder of the upper inner quadrant. No suspicious findings
identified.
IMPRESSION: Benign left breast fibrocystic changes corresponding with the
screening mammographic finding.

RECOMMENDATION:
Screening mammogram in one year.(Code:88-0-S3K)

I have discussed the findings and recommendations with the patient.
If applicable, a reminder letter will be sent to the patient
regarding the next appointment.

BI-RADS CATEGORY  2: Benign.

## 2024-08-04 ENCOUNTER — Other Ambulatory Visit: Payer: Self-pay

## 2024-08-04 ENCOUNTER — Telehealth: Payer: Self-pay | Admitting: Nurse Practitioner

## 2024-08-04 DIAGNOSIS — J40 Bronchitis, not specified as acute or chronic: Secondary | ICD-10-CM

## 2024-08-04 MED ORDER — ALBUTEROL SULFATE HFA 108 (90 BASE) MCG/ACT IN AERS
2.0000 | INHALATION_SPRAY | Freq: Four times a day (QID) | RESPIRATORY_TRACT | 0 refills | Status: AC | PRN
  Filled 2024-08-04: qty 6.7, 25d supply, fill #0

## 2024-08-04 MED ORDER — DOXYCYCLINE HYCLATE 100 MG PO TABS
100.0000 mg | ORAL_TABLET | Freq: Two times a day (BID) | ORAL | 0 refills | Status: AC
  Filled 2024-08-04: qty 14, 7d supply, fill #0

## 2024-08-04 MED ORDER — PREDNISONE 20 MG PO TABS
20.0000 mg | ORAL_TABLET | Freq: Two times a day (BID) | ORAL | 0 refills | Status: AC
  Filled 2024-08-04: qty 10, 5d supply, fill #0

## 2024-08-04 NOTE — Progress Notes (Signed)
 Acute Video Visit    Virtual Visit Consent:   [name], you are scheduled for a virtual visit with a [facility] provider today.     Just as with appointments in the office, your consent must be obtained to participate.  Your consent will be active for this visit and any virtual visit you may have with one of our providers in the next 365 days.     If you have a MyChart account, a copy of this consent can be sent to you electronically.  All virtual visits are billed to your insurance company just like a traditional visit in the office.    If the connection with a video visit is poor, the visit may have to be switched to a telephone visit.  With either a video or telephone visit, we are not always able to ensure that we have a secure connection.     I need to obtain your verbal consent now.   Are you willing to proceed with your visit today?    [name] has provided verbal consent on 08/04/2024 for a virtual visit (video or telephone).   Lauraine Kitty, FNP  Date: 08/04/2024 1:34 PM  Subjective:     Patient ID: Barbara Haynes, female    DOB: [birth year], 62 y.o.   MRN: [id number]  [name], connected with  [name]  ([id number], 10/20/1960) on 08/04/2024 at 11:40 AM EST by a video-enabled telemedicine application and verified that I am speaking with the correct person using two identifiers.   Location: Patient: Home  Provider: Virtual Visit Location Provider: Home Office   I discussed the limitations of evaluation and management by telemedicine and the availability of in person appointments. The patient expressed understanding and agreed to proceed.     HPI  Barbara Haynes is a 63 y.o. who identifies as a female who was assigned female at birth, and is being seen today for cough and congestion. She has been coughing 5 days ago, had a fever yesterday of 101.9. States her baseline temp is 96.   Noted that the mucous she is producing is thicker and darker  Feels her cough  is barking or rattling and she hears crackling when she lays down   She has been using an over the counter antihistamine   She has been prone to bronchitis and pneumonia in the past, has used both prednisone and Albuterol for relief   Review of Systems  Constitutional:  Positive for fever.  HENT:  Positive for congestion.   Respiratory:  Positive for cough and wheezing.   Cardiovascular: Negative.   Genitourinary: Negative.   Skin: Negative.   Neurological: Negative.         Objective:     Physical Exam Constitutional:      General: She is not in acute distress. Pulmonary:     Effort: Pulmonary effort is normal. No respiratory distress.     Comments: Barking cough  Neurological:     Mental Status: She is alert and oriented to person, place, and time.  Psychiatric:        Mood and Affect: Mood normal.          Assessment & Plan:   Follow up with PCP at [facility] Monday if no improvement or with new concerns   1. Bronchitis (Primary) - predniSONE (DELTASONE) 20 MG tablet; Take 1 tablet (20 mg total) by mouth 2 (two) times daily with a meal for 5 days.  Dispense: 10 tablet; Refill:  0 - doxycycline (VIBRA-TABS) 100 MG tablet; Take 1 tablet (100 mg total) by mouth 2 (two) times daily for 7 days.  Dispense: 14 tablet; Refill: 0 - albuterol (VENTOLIN HFA) 108 (90 Base) MCG/ACT inhaler; Inhale 2 puffs into the lungs every 6 (six) hours as needed for wheezing or shortness of breath.  Dispense: 8 g; Refill: 0    Follow Up Instructions: I discussed the assessment and treatment plan with the patient. The patient was provided an opportunity to ask questions and all were answered. The patient agreed with the plan and demonstrated an understanding of the instructions.  A copy of instructions were sent to the patient via MyChart unless otherwise noted below.    The patient was advised to call back or seek an in-person evaluation if the symptoms worsen or if the condition fails to  improve as anticipated.    Lauraine Kitty, FNP  **Disclaimer: This note may have been dictated with voice recognition software. Similar sounding words can inadvertently be transcribed and this note may contain transcription errors which may not have been corrected upon publication of note.**
